# Patient Record
Sex: Male | Born: 1940 | Hispanic: Yes | Marital: Married | Smoking: Former smoker
Health system: Southern US, Community
[De-identification: ages and names within clinical notes are randomized; demographics above are authoritative.]

## PROBLEM LIST (undated history)

## (undated) DIAGNOSIS — R7303 Prediabetes: Secondary | ICD-10-CM

## (undated) DIAGNOSIS — E785 Hyperlipidemia, unspecified: Secondary | ICD-10-CM

## (undated) DIAGNOSIS — E039 Hypothyroidism, unspecified: Secondary | ICD-10-CM

## (undated) DIAGNOSIS — N400 Enlarged prostate without lower urinary tract symptoms: Secondary | ICD-10-CM

## (undated) DIAGNOSIS — U071 COVID-19: Secondary | ICD-10-CM

## (undated) DIAGNOSIS — Z789 Other specified health status: Secondary | ICD-10-CM

## (undated) DIAGNOSIS — Z973 Presence of spectacles and contact lenses: Secondary | ICD-10-CM

## (undated) DIAGNOSIS — J45909 Unspecified asthma, uncomplicated: Secondary | ICD-10-CM

## (undated) HISTORY — DX: Unspecified asthma, uncomplicated: J45.909

---

## 1995-09-01 HISTORY — PX: FINGER SURGERY: SHX640

## 2008-07-27 ENCOUNTER — Encounter: Payer: Self-pay | Admitting: Neurosurgery

## 2008-07-27 ENCOUNTER — Inpatient Hospital Stay (HOSPITAL_COMMUNITY): Admission: AD | Admit: 2008-07-27 | Discharge: 2008-07-30 | Payer: Self-pay | Admitting: Neurosurgery

## 2011-01-13 NOTE — Consult Note (Signed)
NAMEMUHAMMAD, VACCA NO.:  [id number]   MEDICAL RECORD NO.:  [id number]          PATIENT TYPE:  EMS   LOCATION:  ED                           FACILITY:  [facility]   PHYSICIAN:  Cristi Loron, M.D.DATE OF BIRTH:  03-19-1941   DATE OF CONSULTATION:  07/27/2008  DATE OF DISCHARGE:  07/27/2008                                 CONSULTATION   CHIEF COMPLAINT:  Numbness.   HISTORY OF PRESENT ILLNESS:  The patient is a 70 year old Hispanic male,  an immigrant from [place], who began having some numbness in his right  arm a few days ago.  He does not recall any precipitating event.  This  progressed to a more generalized right-sided weakness and numbness in  his right face.  The patient was taken to [facility] via  private vehicle by his son and worked up with a cranial CT scan,  followed by an MRI scan by Dr. Freida Busman, and a brainstem lesion was  identified, and neurosurgical consultation was requested.   Presently, the patient is accompanied by his son and grandson, who speak  excellent [place].  He denies headaches, nausea, vomiting, or seizures.  He says he is a bit numb on the right side.  He has been somewhat  unsteady on his feet.  There has been no history of fevers, etc.  He has  no known parasitic diseases.   PAST MEDICAL HISTORY:  Negative.   PAST SURGICAL HISTORY:  None.   MEDICATIONS PRIOR TO ADMISSION:  None.   ALLERGIES:  No known drug allergies.   FAMILY MEDICAL HISTORY:  The patient's mother is still alive in her 52s.  The patient's father died in his 33s.   SOCIAL HISTORY:  The patient is married.  He has 2 children.  He is an  immigrant from [place].  He is retired.  He lives in [place].  He  denies tobacco, ethanol, or drug use.   REVIEW OF SYSTEMS:  Template.   PHYSICAL EXAMINATION:  GENERAL:  A pleasant 70 year old Hispanic male,  in no apparent distress, with a left ptosis.  VITAL SIGNS:  Blood pressure 141/78, heart rate 95,  respiratory rate 18,  and temperature 98.3 degrees Fahrenheit orally.  HEENT:  Normocephalic.  No signs of trauma.  He has left ptosis.  His  pupils are equal, round, and reactive to light.  Extraocular muscles are  intact.  There is no evidence of CSF otorrhea/rhinorrhea, Battle sign,  or raccoon eyes.  NECK:  Supple.  There are no masses, deformities, tracheal deviation,  jugular venous distention or bruits.  He has a normal range of motion  for his age.  Spurling testing is negative.  Lhermitte sign was not  present.  THORAX:  Symmetric.  ABDOMEN:  Soft.  EXTREMITIES:  No obvious deformities.  BACK:  Normal.  NEUROLOGIC:  The patient is alert and oriented x3.  Cranial nerves II  through XII were examined bilaterally, grossly normal.  Vision and  hearing grossly normal bilaterally.  He denies diplopia.  Motor strength  is 5/5 in his bilateral biceps, triceps, handgrip, quadriceps,  gastrocnemius, and extensor hallucis longus.  Sensory function  demonstrates some decreased light touch sensation in his right face and  arm, otherwise unremarkable.  Deep tendon reflexes are 2/4 in bilateral  biceps, triceps, and quadriceps.   IMAGING STUDIES:  I have reviewed the patient's cranial CT scan  performed at [facility] today, i.e., July 27, 2008.  It  demonstrates the patient has a hyperdensity in his left midbrain in the  region of the left quadrigeminal plate.  There is some mass effect on  aqueduct of [place].  I do not see any signs of acute hydrocephalus.   I also reviewed the patient's brain MRI performed at [facility] on July 27, 2008.  It demonstrates similar findings as  above with some signal intensity consistent with hemorrhage.   ASSESSMENT AND PLAN:  Left midbrain/quadrigeminal plate hemorrhage.  There are several possibilities, most likely of which is he has a  brainstem cavernous angioma which has bled a bit.  I discussed this with  the patient  and his son and grandson.  I am recommending he be admitted  for observation, and we will follow him with serial scans, and after  blood resolves, we will repeat his brain MRI scan.  I did tell him there  are other possibilities such as tumor, other types of vascular  malformations, and the possibility of infection such as  neurocysticercosis, although I doubt it.  I have answered all their  questions.  He is going to be transferred to [facility] and  observed there.      Cristi Loron, M.D.  Electronically Signed     JDJ/MEDQ  D:  07/27/2008  T:  07/28/2008  Job:  [id number]

## 2011-01-16 NOTE — Discharge Summary (Signed)
NAMELEVAUGHN, PUCCINELLI NO.:  [id number]   MEDICAL RECORD NO.:  [id number]          PATIENT TYPE:  INP   LOCATION:  3113                         FACILITY:  [facility]   PHYSICIAN:  Cristi Loron, M.D.DATE OF BIRTH:  1941-03-07   DATE OF ADMISSION:  07/27/2008  DATE OF DISCHARGE:  07/30/2008                               DISCHARGE SUMMARY   For full details of this admission, please refer to typed history and  physical.   HOSPITAL COURSE:  The patient is a 70 year old Hispanic male who was  admitted for right hand numbness and MRI scan, which demonstrated he had  a midbrain lesion with a small hemorrhage presumed to be cavernous  angioma.  Admitted the patient to [facility] on July 27, 2008.  Observed him.  Repeated  his CAT scan and it was stable.  His  symptoms resolved and by July 30, 2008, the patient was afebrile.  He was neurologically normal.  He was requesting discharge to home.  He  was therefore discharged to home.   DISCHARGE INSTRUCTIONS:  The patient was given discharge instructions.  He was instructed to follow up with me in 4 weeks.   FINAL DIAGNOSIS:  Brainstem cavernous angioma.   PROCEDURE PERFORMED:  None.      Cristi Loron, M.D.  Electronically Signed     JDJ/MEDQ  D:  09/13/2008  T:  09/14/2008  Job:  [id number]

## 2011-06-02 LAB — DIFFERENTIAL
Basophils Relative: 0
Eosinophils Absolute: 0
Neutro Abs: 5.7
Neutrophils Relative %: 76

## 2011-06-02 LAB — CBC
MCHC: 33.1
MCV: 88.7
Platelets: 298
WBC: 7.6

## 2011-06-02 LAB — POCT I-STAT, CHEM 8
Chloride: 101
HCT: 52
Hemoglobin: 17.7 — ABNORMAL HIGH
Potassium: 4.8
Sodium: 137

## 2011-06-02 LAB — PROTIME-INR: INR: 1

## 2011-06-02 LAB — COMPREHENSIVE METABOLIC PANEL
ALT: 27
AST: 26
Albumin: 3.7
Alkaline Phosphatase: 94
GFR calc Af Amer: >60
Potassium: 4.3
Sodium: 138
Total Protein: 6.6

## 2014-08-31 HISTORY — PX: HERNIA REPAIR: SHX51

## 2017-05-17 ENCOUNTER — Other Ambulatory Visit: Payer: Self-pay | Admitting: Orthopedic Surgery

## 2017-05-17 DIAGNOSIS — M87052 Idiopathic aseptic necrosis of left femur: Secondary | ICD-10-CM

## 2017-05-26 ENCOUNTER — Ambulatory Visit
Admission: RE | Admit: 2017-05-26 | Discharge: 2017-05-26 | Disposition: A | Discharge status: Home / Self Care | Payer: Medicare Other | Source: Ambulatory Visit | Attending: Orthopedic Surgery | Admitting: Orthopedic Surgery

## 2017-05-26 DIAGNOSIS — M1612 Unilateral primary osteoarthritis, left hip: Secondary | ICD-10-CM | POA: Insufficient documentation

## 2017-05-26 DIAGNOSIS — M25552 Pain in left hip: Secondary | ICD-10-CM | POA: Insufficient documentation

## 2017-05-26 DIAGNOSIS — M65852 Other synovitis and tenosynovitis, left thigh: Secondary | ICD-10-CM | POA: Insufficient documentation

## 2017-05-26 DIAGNOSIS — M25452 Effusion, left hip: Secondary | ICD-10-CM | POA: Insufficient documentation

## 2017-05-26 DIAGNOSIS — N400 Enlarged prostate without lower urinary tract symptoms: Secondary | ICD-10-CM | POA: Insufficient documentation

## 2017-05-26 DIAGNOSIS — M87052 Idiopathic aseptic necrosis of left femur: Secondary | ICD-10-CM | POA: Diagnosis present

## 2017-06-15 ENCOUNTER — Encounter
Admission: RE | Admit: 2017-06-15 | Discharge: 2017-06-15 | Disposition: A | Discharge status: Home / Self Care | Payer: Medicare Other | Source: Ambulatory Visit | Attending: Orthopedic Surgery | Admitting: Orthopedic Surgery

## 2017-06-15 DIAGNOSIS — Z01812 Encounter for preprocedural laboratory examination: Secondary | ICD-10-CM | POA: Insufficient documentation

## 2017-06-15 DIAGNOSIS — Z01818 Encounter for other preprocedural examination: Secondary | ICD-10-CM | POA: Insufficient documentation

## 2017-06-15 DIAGNOSIS — I4510 Unspecified right bundle-branch block: Secondary | ICD-10-CM | POA: Diagnosis not present

## 2017-06-15 DIAGNOSIS — Z0183 Encounter for blood typing: Secondary | ICD-10-CM | POA: Insufficient documentation

## 2017-06-15 HISTORY — DX: Other specified health status: Z78.9

## 2017-06-15 LAB — URINALYSIS, ROUTINE W REFLEX MICROSCOPIC
BILIRUBIN URINE: NEGATIVE
GLUCOSE, UA: NEGATIVE mg/dL
HGB URINE DIPSTICK: NEGATIVE
KETONES UR: NEGATIVE mg/dL
Leukocytes, UA: NEGATIVE
NITRITE: NEGATIVE
PH: 6 (ref 5.0–8.0)
Protein, ur: NEGATIVE mg/dL
SPECIFIC GRAVITY, URINE: 1.006 (ref 1.005–1.030)

## 2017-06-15 LAB — CBC
HEMATOCRIT: 44.1 % (ref 40.0–52.0)
HEMOGLOBIN: 14.7 g/dL (ref 13.0–18.0)
MCH: 28.9 pg (ref 26.0–34.0)
MCHC: 33.3 g/dL (ref 32.0–36.0)
MCV: 86.6 fL (ref 80.0–100.0)
PLATELETS: 263 10*3/uL (ref 150–440)
RBC: 5.09 MIL/uL (ref 4.40–5.90)
RDW: 14.6 % — ABNORMAL HIGH (ref 11.5–14.5)
WBC: 5.2 10*3/uL (ref 3.8–10.6)

## 2017-06-15 LAB — SURGICAL PCR SCREEN
MRSA, PCR: NEGATIVE
STAPHYLOCOCCUS AUREUS: NEGATIVE

## 2017-06-15 LAB — BASIC METABOLIC PANEL
ANION GAP: 8 (ref 5–15)
BUN: 17 mg/dL (ref 6–20)
CALCIUM: 9.1 mg/dL (ref 8.9–10.3)
CO2: 28 mmol/L (ref 22–32)
Chloride: 102 mmol/L (ref 101–111)
Creatinine, Ser: 0.87 mg/dL (ref 0.61–1.24)
GFR calc Af Amer: >60 mL/min (ref >60)
Glucose, Bld: 100 mg/dL — ABNORMAL HIGH (ref 65–99)
POTASSIUM: 4 mmol/L (ref 3.5–5.1)
SODIUM: 138 mmol/L (ref 135–145)

## 2017-06-15 LAB — TYPE AND SCREEN
ABO/RH(D): O POS
ANTIBODY SCREEN: NEGATIVE

## 2017-06-15 LAB — SEDIMENTATION RATE: SED RATE: 4 mm/h (ref 0–20)

## 2017-06-15 LAB — PROTIME-INR
INR: 0.95
PROTHROMBIN TIME: 12.6 s (ref 11.4–15.2)

## 2017-06-15 LAB — APTT: aPTT: 27 seconds (ref 24–36)

## 2017-06-15 NOTE — Pre-Procedure Instructions (Signed)
Called Joel Adkins to see what time pt is scheduled for surgery, she was unable to tell me due to Dr. Rudene Christians deciding the order for surgery 1-2 days prior to surgery.  Pt's son will call on 06/21/17 to find out pt's arrival time.

## 2017-06-15 NOTE — Patient Instructions (Signed)
Your procedure is scheduled on: 06/22/2017. Su procedimiento est programado para: Report to Same Day Surgery. Presntese a: To find out your arrival time please call [phone] between 1PM - 3PM on Monday 06/21/2017. Para saber su hora de llegada por favor llame al [phone] entre la 1PM - 3PM el da:  Remember: Instructions that are not followed completely may result in serious medical risk, up to and including death, or upon the discretion of your surgeon and anesthesiologist your surgery may need to be rescheduled.  Recuerde: Las instrucciones que no se siguen completamente Heritage manager en un riesgo de salud grave, incluyendo hasta la [place] o a discrecin de su cirujano y Environmental health practitioner, su ciruga se puede posponer.   __X_ 1.Do not eat food after midnight the night before your procedure. No   gum chewing or hard candies. You may drink clear liquids up to 2 hours   before you are scheduled to arrive for your surgery- DO not drink clear   liquids within 2 hours of the start of your surgery.     Clear Liquids include:    water, apple juice without pulp, clear carbohydrate drink such as    Clearfast of [place], Black Coffee or Tea (Do not add anything to   coffee or tea).      No coma nada despus de la medianoche de la noche anterior a su   procedimiento. No coma chicles ni caramelos duros. Puede tomar   lquidos claros hasta 2 horas antes de su hora programada de llegada al   hospital para su procedimiento. No tome lquidos claros durante el   transcurso de las 2 horas de su llegada programada al hospital para su   procedimiento, ya que esto puede llevar a que su procedimiento se   retrase o tenga que volver a Health and safety inspector.  Los lquidos claros incluyen:         - Agua o jugo de [place] [facility] sin pulpa         - Bebidas claras con carbohidratos como ClearFast o Gatorade         - Caf negro o t claro (sin leche, sin cremas, no agregue nada al caf ni al  t)  No tome nada que no est  en esta lista.  Los pacientes con diabetes tipo 1 y tipo 2 solo deben Agricultural engineer.  Llame a la clnica de [facility] o a la unidad de Same Day Surgery si  tiene alguna pregunta sobre estas instrucciones.              _X__ 2.Do Not Smoke or use e-cigarettes For 24 Hours Prior to Your    Surgery.  Do not use any chewable tobacco products for at least 6   hours prior to surgery.    No fume ni use cigarrillos electrnicos durante las 24 horas previas   a su [place].  No use ningn producto de tabaco masticable durante  al menos 6 horas antes de la [place].     __X_ 3. No alcohol for 24 hours before or after surgery.    No tome alcohol durante las 24 horas antes ni despus de la [place].   ____4. Bring all medications with you on the day of surgery if instructed.    Lleve todos los medicamentos con usted el da de su ciruga si se le   ha indicado as.   ____ 5. Notify your doctor if there is any change in your medical condition (cold,   fever, infections).  Informe a su mdico si hay algn cambio en su condicin mdica   (resfriado, fiebre, infecciones).   Do not wear jewelry, make-up, hairpins, clips or nail polish.  No use joyas, maquillajes, pinzas/ganchos para el cabello ni esmalte de uas.  Do not wear lotions, powders, or perfumes. You may wear deodorant.  No use lociones, polvos o perfumes.  Puede usar desodorante.    Do not shave 48 hours prior to surgery. Men may shave face and neck.  No se afeite 48 horas antes de la [place].  Los hombres pueden [employer] cara  y el cuello.   Do not bring valuables to the hospital.   No lleve objetos [facility] is not responsible for any belongings or valuables.  [place] no se hace responsable de ningn tipo de pertenencias u objetos de Geographical information systems officer.               Contacts, dentures or bridgework may not be worn into surgery.  Los lentes de [place], las dentaduras postizas o puentes no se pueden usar en la [place].  Leave  your suitcase in the car. After surgery it may be brought to your room.  Deje su maleta en el auto.  Despus de la ciruga podr traerla a su habitacin.  For patients admitted to the hospital, discharge time is determined by your treatment team.  Para los pacientes que sean ingresados al hospital, el tiempo en el cual se le dar de alta es determinado por su equipo de [place].   Patients discharged the day of surgery will not be allowed to drive home. A los pacientes que se les da de alta el mismo da de la ciruga no se les permitir conducir a Holiday representative.   Please read over the following fact sheets that you were given: Por favor [place] hojas de informacin que le dieron:   Preparing for surgery   ____ Take these medicines the morning of surgery with A SIP OF [place] de la ciruga con UN SORBO DE AGUA:    ____ Fleet Enema (as directed)          Enema de Fleet (segn lo indicado)    _x___ Use CHG Soap as directed          Utilice el jabn de CHG segn lo indicado  ____ Use inhalers on the day of surgery          Use los inhaladores el da de la ciruga  ____ Stop metformin 2 days prior to surgery          Deje de tomar el metformin 2 das antes de la ciruga    ____ Take 1/2 of usual insulin dose the night before surgery and none on the morning of surgery           Tome la mitad de la dosis habitual de insulina la noche antes de la [place] y no tome nada en la maana de la             ciruga  ____ Stop Coumadin/Plavix/aspirin on does not apply          Deje de tomar el Coumadin/Plavix/aspirina el da:  ____ Stop Anti-inflammatories on: diclofenac (VOLTAREN).  OK to take Tylenol.          Deje de tomar antiinflamatorios el da:   ____ Stop supplements until after surgery  Deje de tomar suplementos hasta despus de la ciruga  ____ Bring C-Pap to the hospital          [place] al hospital

## 2017-06-15 NOTE — Pre-Procedure Instructions (Signed)
Maritza here to interprete.

## 2017-06-16 LAB — URINE CULTURE: Culture: NO GROWTH

## 2017-06-17 NOTE — Progress Notes (Signed)
EKG results reviewed by Dr. Marcello Moores (anesthesiology); recommends patient get medical clearance prior to surgery due to possible new right bundle branch block. Medical clearance form sent to patient's PCP Gennette Pac, FNP).

## 2017-07-09 NOTE — Pre-Procedure Instructions (Signed)
Cardiac clearance from Dr. Saralyn Pilar low risk on front of chart.  Medical clearance from [facility] FNP low risk in front of pt's chart.

## 2017-07-13 ENCOUNTER — Other Ambulatory Visit: Payer: Self-pay

## 2017-07-13 ENCOUNTER — Inpatient Hospital Stay: Payer: Medicare Other

## 2017-07-13 ENCOUNTER — Encounter
Admission: RE | Disposition: A | Discharge status: Home Health | Payer: Self-pay | Source: Ambulatory Visit | Attending: Orthopedic Surgery

## 2017-07-13 ENCOUNTER — Inpatient Hospital Stay
Admission: RE | Admit: 2017-07-13 | Discharge: 2017-07-16 | DRG: 470 | Disposition: A | Discharge status: Home Health | Payer: Medicare Other | Source: Ambulatory Visit | Attending: Orthopedic Surgery | Admitting: Orthopedic Surgery

## 2017-07-13 ENCOUNTER — Inpatient Hospital Stay: Payer: Medicare Other | Admitting: Anesthesiology

## 2017-07-13 DIAGNOSIS — Z419 Encounter for procedure for purposes other than remedying health state, unspecified: Secondary | ICD-10-CM

## 2017-07-13 DIAGNOSIS — M87052 Idiopathic aseptic necrosis of left femur: Principal | ICD-10-CM | POA: Diagnosis present

## 2017-07-13 DIAGNOSIS — G8918 Other acute postprocedural pain: Secondary | ICD-10-CM

## 2017-07-13 DIAGNOSIS — Z87891 Personal history of nicotine dependence: Secondary | ICD-10-CM | POA: Diagnosis not present

## 2017-07-13 DIAGNOSIS — M87852 Other osteonecrosis, left femur: Secondary | ICD-10-CM | POA: Diagnosis present

## 2017-07-13 DIAGNOSIS — M25552 Pain in left hip: Secondary | ICD-10-CM | POA: Diagnosis present

## 2017-07-13 HISTORY — PX: TOTAL HIP ARTHROPLASTY: SHX124

## 2017-07-13 LAB — TYPE AND SCREEN
ABO/RH(D): O POS
ANTIBODY SCREEN: NEGATIVE

## 2017-07-13 SURGERY — ARTHROPLASTY, HIP, TOTAL, ANTERIOR APPROACH
Anesthesia: Spinal | Site: Hip | Laterality: Left | Wound class: Clean

## 2017-07-13 MED ORDER — ONDANSETRON HCL 4 MG/2ML IJ SOLN: 4.0000 mg | Freq: Four times a day (QID) | INTRAMUSCULAR | Status: DC | PRN

## 2017-07-13 MED ORDER — ENOXAPARIN SODIUM 40 MG/0.4ML ~~LOC~~ SOLN
40.0000 mg | SUBCUTANEOUS | Status: DC
  Administered 2017-07-14 – 2017-07-16 (×3): 40 mg via SUBCUTANEOUS
  Filled 2017-07-13 (×3): qty 0.4

## 2017-07-13 MED ORDER — OXYCODONE HCL 5 MG PO TABS
5.0000 mg | ORAL_TABLET | ORAL | Status: DC | PRN
  Administered 2017-07-13 – 2017-07-16 (×8): 5 mg via ORAL
  Filled 2017-07-13 (×6): qty 1

## 2017-07-13 MED ORDER — FENTANYL CITRATE (PF) 100 MCG/2ML IJ SOLN
INTRAMUSCULAR | Status: DC | PRN
  Administered 2017-07-13: 25 ug via INTRAVENOUS

## 2017-07-13 MED ORDER — MAGNESIUM CITRATE PO SOLN
1.0000 | Freq: Once | ORAL | Status: DC | PRN
  Filled 2017-07-13 (×2): qty 296

## 2017-07-13 MED ORDER — PROPOFOL 500 MG/50ML IV EMUL
INTRAVENOUS | Status: DC | PRN
  Administered 2017-07-13: 50 ug/kg/min via INTRAVENOUS

## 2017-07-13 MED ORDER — ACETAMINOPHEN 650 MG RE SUPP: 650.0000 mg | RECTAL | Status: DC | PRN

## 2017-07-13 MED ORDER — MAGNESIUM HYDROXIDE 400 MG/5ML PO SUSP
30.0000 mL | Freq: Every day | ORAL | Status: DC | PRN
  Administered 2017-07-14 – 2017-07-15 (×2): 30 mL via ORAL
  Filled 2017-07-13: qty 30

## 2017-07-13 MED ORDER — FAMOTIDINE 20 MG PO TABS
20.0000 mg | ORAL_TABLET | Freq: Once | ORAL | Status: AC
  Administered 2017-07-13: 20 mg via ORAL

## 2017-07-13 MED ORDER — ZOLPIDEM TARTRATE 5 MG PO TABS: 5.0000 mg | ORAL_TABLET | Freq: Every evening | ORAL | Status: DC | PRN

## 2017-07-13 MED ORDER — METOCLOPRAMIDE HCL 5 MG/ML IJ SOLN: 5.0000 mg | Freq: Three times a day (TID) | INTRAMUSCULAR | Status: DC | PRN

## 2017-07-13 MED ORDER — CEFAZOLIN SODIUM-DEXTROSE 2-4 GM/100ML-% IV SOLN
2.0000 g | Freq: Once | INTRAVENOUS | Status: DC
  Filled 2017-07-13 (×3): qty 100

## 2017-07-13 MED ORDER — BUPIVACAINE HCL (PF) 0.5 % IJ SOLN
INTRAMUSCULAR | Status: AC
  Filled 2017-07-13: qty 10

## 2017-07-13 MED ORDER — BUPIVACAINE HCL (PF) 0.5 % IJ SOLN
INTRAMUSCULAR | Status: DC | PRN
  Administered 2017-07-13: 3 mL

## 2017-07-13 MED ORDER — PHENYLEPHRINE HCL 10 MG/ML IJ SOLN
INTRAMUSCULAR | Status: AC
  Filled 2017-07-13: qty 1

## 2017-07-13 MED ORDER — DOCUSATE SODIUM 100 MG PO CAPS
100.0000 mg | ORAL_CAPSULE | Freq: Two times a day (BID) | ORAL | Status: DC
  Administered 2017-07-13 – 2017-07-16 (×6): 100 mg via ORAL
  Filled 2017-07-13 (×6): qty 1

## 2017-07-13 MED ORDER — TRANEXAMIC ACID 1000 MG/10ML IV SOLN
1000.0000 mg | INTRAVENOUS | Status: DC
  Filled 2017-07-13: qty 10

## 2017-07-13 MED ORDER — MORPHINE SULFATE (PF) 2 MG/ML IV SOLN
2.0000 mg | INTRAVENOUS | Status: DC | PRN
  Administered 2017-07-13: 2 mg via INTRAVENOUS
  Filled 2017-07-13: qty 1

## 2017-07-13 MED ORDER — METHOCARBAMOL 500 MG PO TABS
500.0000 mg | ORAL_TABLET | Freq: Four times a day (QID) | ORAL | Status: DC | PRN
  Administered 2017-07-15: 500 mg via ORAL
  Filled 2017-07-13: qty 1

## 2017-07-13 MED ORDER — PHENOL 1.4 % MT LIQD
1.0000 | OROMUCOSAL | Status: DC | PRN
  Filled 2017-07-13: qty 177

## 2017-07-13 MED ORDER — FENTANYL CITRATE (PF) 100 MCG/2ML IJ SOLN: 25.0000 ug | INTRAMUSCULAR | Status: DC | PRN

## 2017-07-13 MED ORDER — GABAPENTIN 100 MG PO CAPS
100.0000 mg | ORAL_CAPSULE | Freq: Every day | ORAL | Status: DC
  Administered 2017-07-13 – 2017-07-15 (×3): 100 mg via ORAL
  Filled 2017-07-13 (×3): qty 1

## 2017-07-13 MED ORDER — MENTHOL 3 MG MT LOZG
1.0000 | LOZENGE | OROMUCOSAL | Status: DC | PRN
  Filled 2017-07-13: qty 9

## 2017-07-13 MED ORDER — DEXTROSE 5 % IV SOLN
Freq: Four times a day (QID) | INTRAVENOUS | Status: AC
  Administered 2017-07-13 – 2017-07-14 (×3): via INTRAVENOUS
  Filled 2017-07-13 (×3): qty 10

## 2017-07-13 MED ORDER — ACETAMINOPHEN 325 MG PO TABS
650.0000 mg | ORAL_TABLET | ORAL | Status: DC | PRN
  Filled 2017-07-13: qty 2

## 2017-07-13 MED ORDER — FE FUMARATE-B12-VIT C-FA-IFC PO CAPS
1.0000 | ORAL_CAPSULE | Freq: Two times a day (BID) | ORAL | Status: DC
  Administered 2017-07-14 – 2017-07-16 (×5): 1 via ORAL
  Filled 2017-07-13 (×6): qty 1

## 2017-07-13 MED ORDER — BUPIVACAINE-EPINEPHRINE (PF) 0.25% -1:200000 IJ SOLN
INTRAMUSCULAR | Status: AC
  Filled 2017-07-13: qty 30

## 2017-07-13 MED ORDER — DIPHENHYDRAMINE HCL 12.5 MG/5ML PO ELIX
12.5000 mg | ORAL_SOLUTION | ORAL | Status: DC | PRN
Start: 2017-07-13 — End: 2017-07-16

## 2017-07-13 MED ORDER — CEFAZOLIN SODIUM-DEXTROSE 2-3 GM-%(50ML) IV SOLR
INTRAVENOUS | Status: DC | PRN
  Administered 2017-07-13: 2 g via INTRAVENOUS

## 2017-07-13 MED ORDER — CEFAZOLIN SODIUM-DEXTROSE 2-4 GM/100ML-% IV SOLN
INTRAVENOUS | Status: AC
  Filled 2017-07-13: qty 100

## 2017-07-13 MED ORDER — METHOCARBAMOL 1000 MG/10ML IJ SOLN
500.0000 mg | Freq: Four times a day (QID) | INTRAVENOUS | Status: DC | PRN
  Filled 2017-07-13: qty 5

## 2017-07-13 MED ORDER — MIDAZOLAM HCL 5 MG/5ML IJ SOLN
INTRAMUSCULAR | Status: DC | PRN
  Administered 2017-07-13: 1 mg via INTRAVENOUS

## 2017-07-13 MED ORDER — SODIUM CHLORIDE 0.9 % IV SOLN
INTRAVENOUS | Status: DC
  Administered 2017-07-13: 21:00:00 via INTRAVENOUS

## 2017-07-13 MED ORDER — LACTATED RINGERS IV SOLN
INTRAVENOUS | Status: DC
Start: 2017-07-13 — End: 2017-07-13
  Administered 2017-07-13 (×3): via INTRAVENOUS

## 2017-07-13 MED ORDER — ONDANSETRON HCL 4 MG/2ML IJ SOLN: 4.0000 mg | Freq: Once | INTRAMUSCULAR | Status: DC | PRN

## 2017-07-13 MED ORDER — MIDAZOLAM HCL 2 MG/2ML IJ SOLN
INTRAMUSCULAR | Status: AC
  Filled 2017-07-13: qty 2

## 2017-07-13 MED ORDER — SODIUM CHLORIDE 0.9 % IV SOLN
INTRAVENOUS | Status: DC | PRN
  Administered 2017-07-13: 1000 mg via INTRAVENOUS

## 2017-07-13 MED ORDER — CEFAZOLIN SODIUM-DEXTROSE 1-4 GM/50ML-% IV SOLN: 1.0000 g | Freq: Four times a day (QID) | INTRAVENOUS | Status: DC

## 2017-07-13 MED ORDER — OXYCODONE HCL 5 MG PO TABS
10.0000 mg | ORAL_TABLET | ORAL | Status: DC | PRN
  Administered 2017-07-14 – 2017-07-15 (×4): 10 mg via ORAL
  Filled 2017-07-13 (×6): qty 2

## 2017-07-13 MED ORDER — FENTANYL CITRATE (PF) 100 MCG/2ML IJ SOLN
INTRAMUSCULAR | Status: AC
  Filled 2017-07-13: qty 2

## 2017-07-13 MED ORDER — BISACODYL 10 MG RE SUPP
10.0000 mg | Freq: Every day | RECTAL | Status: DC | PRN
  Administered 2017-07-16: 10 mg via RECTAL
  Filled 2017-07-13: qty 1

## 2017-07-13 MED ORDER — BUPIVACAINE-EPINEPHRINE 0.25% -1:200000 IJ SOLN
INTRAMUSCULAR | Status: DC | PRN
  Administered 2017-07-13: 30 mL

## 2017-07-13 MED ORDER — METOCLOPRAMIDE HCL 10 MG PO TABS: 5.0000 mg | ORAL_TABLET | Freq: Three times a day (TID) | ORAL | Status: DC | PRN

## 2017-07-13 MED ORDER — PHENYLEPHRINE HCL 10 MG/ML IJ SOLN
INTRAMUSCULAR | Status: DC | PRN
  Administered 2017-07-13 (×2): 200 ug via INTRAVENOUS
  Administered 2017-07-13: 100 ug via INTRAVENOUS

## 2017-07-13 MED ORDER — ONDANSETRON HCL 4 MG PO TABS
4.0000 mg | ORAL_TABLET | Freq: Four times a day (QID) | ORAL | Status: DC | PRN
  Administered 2017-07-14: 4 mg via ORAL
  Filled 2017-07-13: qty 1

## 2017-07-13 MED ORDER — HYPROMELLOSE (GONIOSCOPIC) 2.5 % OP SOLN
1.0000 [drp] | Freq: Three times a day (TID) | OPHTHALMIC | Status: DC | PRN
  Filled 2017-07-13: qty 15

## 2017-07-13 MED ORDER — PROPOFOL 10 MG/ML IV BOLUS
INTRAVENOUS | Status: DC | PRN
Start: 2017-07-13 — End: 2017-07-14
  Administered 2017-07-13: 30 mg via INTRAVENOUS

## 2017-07-13 MED ORDER — LIDOCAINE HCL (PF) 2 % IJ SOLN
INTRAMUSCULAR | Status: AC
  Filled 2017-07-13: qty 10

## 2017-07-13 MED ORDER — PHENYLEPHRINE HCL 10 MG/ML IJ SOLN
INTRAMUSCULAR | Status: DC | PRN
  Administered 2017-07-13: 30 ug/min via INTRAVENOUS

## 2017-07-13 MED ORDER — NEOMYCIN-POLYMYXIN B GU 40-200000 IR SOLN
Status: DC | PRN
  Administered 2017-07-13: 4 mL

## 2017-07-13 MED ORDER — NEOMYCIN-POLYMYXIN B GU 40-200000 IR SOLN
Status: AC
  Filled 2017-07-13: qty 4

## 2017-07-13 MED ORDER — PROPOFOL 500 MG/50ML IV EMUL
INTRAVENOUS | Status: AC
  Filled 2017-07-13: qty 50

## 2017-07-13 MED ORDER — FAMOTIDINE 20 MG PO TABS
ORAL_TABLET | ORAL | Status: AC
  Filled 2017-07-13: qty 1

## 2017-07-13 SURGICAL SUPPLY — 52 items
BLADE SAW SAG 18.5X105 (BLADE) ×3 IMPLANT
BNDG COHESIVE 6X5 TAN STRL LF (GAUZE/BANDAGES/DRESSINGS) ×5 IMPLANT
CANISTER SUCT 1200ML W/VALVE (MISCELLANEOUS) ×3 IMPLANT
CAPT HIP TOTAL 3 ×2 IMPLANT
CATH TRAY METER 16FR LF (MISCELLANEOUS) ×3 IMPLANT
CHLORAPREP W/TINT 26ML (MISCELLANEOUS) ×3 IMPLANT
DRAPE C-ARM XRAY 36X54 (DRAPES) ×3 IMPLANT
DRAPE INCISE IOBAN 66X60 STRL (DRAPES) IMPLANT
DRAPE POUCH INSTRU U-SHP 10X18 (DRAPES) ×3 IMPLANT
DRAPE SHEET LG 3/4 BI-LAMINATE (DRAPES) ×9 IMPLANT
DRAPE TABLE BACK 80X90 (DRAPES) ×3 IMPLANT
DRESSING SURGICEL FIBRLLR 1X2 (HEMOSTASIS) ×2 IMPLANT
DRSG OPSITE POSTOP 4X8 (GAUZE/BANDAGES/DRESSINGS) ×6 IMPLANT
DRSG SURGICEL FIBRILLAR 1X2 (HEMOSTASIS) ×6
ELECT BLADE 6.5 EXT (BLADE) ×3 IMPLANT
ELECT REM PT RETURN 9FT ADLT (ELECTROSURGICAL) ×3
ELECTRODE REM PT RTRN 9FT ADLT (ELECTROSURGICAL) ×1 IMPLANT
EVACUATOR 1/8 PVC DRAIN (DRAIN) IMPLANT
GLOVE BIOGEL PI IND STRL 9 (GLOVE) ×1 IMPLANT
GLOVE BIOGEL PI INDICATOR 9 (GLOVE) ×2
GLOVE SURG SYN 9.0  PF PI (GLOVE) ×4
GLOVE SURG SYN 9.0 PF PI (GLOVE) ×2 IMPLANT
GOWN SRG 2XL LVL 4 RGLN SLV (GOWNS) ×1 IMPLANT
GOWN STRL NON-REIN 2XL LVL4 (GOWNS) ×3
GOWN STRL REUS W/ TWL LRG LVL3 (GOWN DISPOSABLE) ×1 IMPLANT
GOWN STRL REUS W/TWL LRG LVL3 (GOWN DISPOSABLE) ×3
HOLDER FOLEY CATH W/STRAP (MISCELLANEOUS) ×3 IMPLANT
HOOD PEEL AWAY FLYTE STAYCOOL (MISCELLANEOUS) ×3 IMPLANT
KIT PREVENA INCISION MGT 13 (CANNISTER) ×1 IMPLANT
MAT BLUE FLOOR 46X72 FLO (MISCELLANEOUS) ×3 IMPLANT
NDL SAFETY 18GX1.5 (NEEDLE) ×3 IMPLANT
NDL SPNL 18GX3.5 QUINCKE PK (NEEDLE) ×1 IMPLANT
NEEDLE SPNL 18GX3.5 QUINCKE PK (NEEDLE) ×3 IMPLANT
NS IRRIG 1000ML POUR BTL (IV SOLUTION) ×3 IMPLANT
PACK HIP COMPR (MISCELLANEOUS) ×3 IMPLANT
SOL PREP PVP 2OZ (MISCELLANEOUS) ×3
SOLUTION PREP PVP 2OZ (MISCELLANEOUS) ×1 IMPLANT
SPONGE DRAIN TRACH 4X4 STRL 2S (GAUZE/BANDAGES/DRESSINGS) ×1 IMPLANT
STAPLER SKIN PROX 35W (STAPLE) ×3 IMPLANT
STRAP SAFETY BODY (MISCELLANEOUS) ×3 IMPLANT
SUT DVC 2 QUILL PDO  T11 36X36 (SUTURE) ×2
SUT DVC 2 QUILL PDO T11 36X36 (SUTURE) ×1 IMPLANT
SUT SILK 0 (SUTURE) ×3
SUT SILK 0 30XBRD TIE 6 (SUTURE) ×1 IMPLANT
SUT V-LOC 90 ABS DVC 3-0 CL (SUTURE) ×3 IMPLANT
SUT VIC AB 1 CT1 36 (SUTURE) ×3 IMPLANT
SYR 20CC LL (SYRINGE) ×3 IMPLANT
SYR 30ML LL (SYRINGE) ×3 IMPLANT
SYR BULB IRRIG 60ML STRL (SYRINGE) ×3 IMPLANT
TAPE MICROFOAM 4IN (TAPE) ×1 IMPLANT
TOWEL OR 17X26 4PK STRL BLUE (TOWEL DISPOSABLE) ×3 IMPLANT
WND VAC CANISTER 500ML (MISCELLANEOUS) ×1 IMPLANT

## 2017-07-13 NOTE — H&P (Signed)
Chief Complaint  Patient presents with  . Hip Pain  Left, discuss CT results   History of the Present Illness: Joel Adkins is a 76 y.o. male here for follow-up of severe left hip osteoarthritis. He was recently seen by Rachelle Hora, PA. The patient had a CT scan to assess his bone quality. He does have some significant bone loss superiorly with very thin bone medially as well in the acetabulum. There is does appear to be adequate bone stock for a total hip replacement.  The patient presents today with an interpreter.  I have reviewed past medical, surgical, social and family history, and allergies as documented in the EMR.  Past Medical History: History reviewed. No pertinent past medical history.  Past Surgical History: Past Surgical History:  Procedure Laterality Date  . HERNIA REPAIR   Past Family History: Family History  Problem Relation Age of Onset  . No Known Problems Mother   Medications: Current Outpatient Prescriptions Ordered in Epic  Medication Sig Dispense Refill  . diclofenac (VOLTAREN) 75 MG EC tablet Take 75 mg by mouth 2 (two) times daily with meals.  . gabapentin (NEURONTIN) 100 MG capsule Take 100 mg by mouth 3 (three) times daily.   No current Epic-ordered facility-administered medications on file.   Allergies: No Known Allergies   Body mass index is 28.17 kg/m.  Review of Systems:A comprehensive 14 point ROS was performed, reviewed, and the pertinent orthopaedic findings are documented in the HPI.  Vitals:  06/09/17 1026  BP: 142/80   General Physical Examination:  General/Constitutional: No apparent distress: well-nourished and well developed. Eyes: Pupils equal, round with synchronous movement. Lungs: Clear to auscultation HEENT: Normal Vascular: No edema, swelling or tenderness, except as noted in detailed exam. Cardiac: Heart rate and rhythm is regular. Integumentary: No impressive skin lesions present, except as noted in  detailed exam. Neuro/Psych: Normal mood and affect, oriented to person, place and time.  Musculoskeletal Examination: On exam, the patient has at least a 1 inch leg length discrepancy on the left. He has good pulses. He has a fixed 20 degree external rotation contracture. He can externally rotate to 30 degrees. He has a very antalgic gait.   Lungs are clear except for mild wheezing. Heart rate and rhythm is normal. HEENT is normal.  Radiographs: The patient's imaging was reviewed today as noted above.  Assessment: ICD-10-CM ICD-9-CM  1. Avascular necrosis of bone of hip, left (CMS-HCC) M87.052 733.42   Plan: We discussed his condition and treatment options today. I recommend he have a left hip total replacement. The procedure and postoperative care were discussed at length today.   Surgical Risks: The nature of the condition and the proposed procedure has been reviewed in detail with the patient. Surgical versus non-surgical options and prognosis for recovery have been reviewed and the inherent risks and benefits of each have been discussed including the risks of infection, bleeding, injury to nerves / blood vessels / tendons, incomplete relief of symptoms, persisting pain and / or stiffness, loss of function, complex regional pain syndrome, failure of procedure, as appropriate.

## 2017-07-13 NOTE — OR Nursing (Signed)
Hospital interpreter present for admission

## 2017-07-13 NOTE — Op Note (Signed)
07/13/2017  5:38 PM  PATIENT:  Joel Adkins  76 y.o. male  PRE-OPERATIVE DIAGNOSIS:  AVASCULAR NECROSIS OF BONE LEFT HIP  POST-OPERATIVE DIAGNOSIS:  AVASCULAR NECROSIS OF BONE LEFT HIP  PROCEDURE:  Procedure(s): TOTAL HIP ARTHROPLASTY ANTERIOR APPROACH (Left)  SURGEON: Laurene Footman, MD  ASSISTANTS: None  ANESTHESIA:   spinal  EBL:  Total I/O In: 1000 [I.V.:1000] Out: 1100 [Urine:400; Blood:700]  BLOOD ADMINISTERED:none  DRAINS: none   LOCAL MEDICATIONS USED:  MARCAINE     SPECIMEN:  Source of Specimen:  Femoral head  DISPOSITION OF SPECIMEN:  PATHOLOGY  COUNTS:  YES  TOURNIQUET:  * No tourniquets in log *  IMPLANTS: Medacta AMIS 1 lateralized stem with 28 Mpact DM and liner with S 28 mm metal head  DICTATION: .Dragon Dictation   The patient was brought to the operating room and after spinal anesthesia was obtained patient was placed on the operative table with the ipsilateral foot into the Medacta attachment, contralateral leg on a well-padded table. C-arm was brought in and preop template x-ray taken. After prepping and draping in usual sterile fashion appropriate patient identification and timeout procedures were completed. Anterior approach to the hip was obtained and centered over the greater trochanter and TFL muscle. The subcutaneous tissue was incised hemostasis being achieved by electrocautery. TFL fascia was incised and the muscle retracted laterally deep retractor placed. The lateral femoral circumflex vessels were identified and ligated. The anterior capsule was exposed and a capsulotomy performed. The neck was identified and a femoral neck cut carried out with a saw. The head was removed without difficulty and showed sclerotic femoral head and acetabulum. Reaming was carried out to 54 mm and a 56 mm cup trial gave appropriate tightness to the acetabular component a 56 cup was impacted into position. The leg was then externally rotated and ischiofemoral and  pubofemoral releases carried out. The femur was sequentially broached to a size 1, size 1 standard width S head trials were placed and the final components chosen. The 1 lateralized stem was inserted along with a S 28 mm head and 56 mm liner. The hip was reduced and was stable the wound was thoroughly irrigated and fibrillar placed along the posterior capsule. The deep fascia was closed using a heavy Quill after infiltration of 30 cc of quarter percent Sensorcaine with epinephrine. 3-0 v-loc to close the skin with skin staples Xeroform and honeycomb dressing applied  PLAN OF CARE: Admit to inpatient

## 2017-07-13 NOTE — Anesthesia Procedure Notes (Signed)
Spinal  Patient location during procedure: OR Start time: 07/13/2017 3:26 PM End time: 07/13/2017 3:32 PM Staffing Anesthesiologist: Kephart, William K, MD Resident/CRNA: Peralta, Nicole, CRNA Performed: resident/CRNA  Preanesthetic Checklist Completed: patient identified, site marked, surgical consent, pre-op evaluation, timeout performed, IV checked, risks and benefits discussed and monitors and equipment checked Spinal Block Patient position: sitting Prep: ChloraPrep Patient monitoring: heart rate, continuous pulse ox, blood pressure and cardiac monitor Approach: midline Location: L4-5 Injection technique: single-shot Needle Needle type: Introducer and Pencan  Needle gauge: 24 G Needle length: 10 cm Assessment Sensory level: T6 Additional Notes Negative paresthesia. Negative blood return. Positive free-flowing CSF. Expiration date of kit checked and confirmed. Patient tolerated procedure well, without complications.       

## 2017-07-13 NOTE — Anesthesia Preprocedure Evaluation (Signed)
Anesthesia Evaluation  Patient identified by MRN, date of birth, ID band Patient awake    Reviewed: Allergy & Precautions, NPO status , Patient's Chart, lab work & pertinent test results  History of Anesthesia Complications Negative for: history of anesthetic complications  Airway Mallampati: III       Dental   Pulmonary neg sleep apnea, neg COPD, former smoker,           Cardiovascular (-) hypertension(-) Past MI and (-) CHF (-) pacemaker(-) Valvular Problems/Murmurs     Neuro/Psych neg Seizures    GI/Hepatic Neg liver ROS, neg GERD  ,  Endo/Other  neg diabetes  Renal/GU negative Renal ROS     Musculoskeletal   Abdominal   Peds  Hematology   Anesthesia Other Findings   Reproductive/Obstetrics                            Anesthesia Physical Anesthesia Plan  ASA: II  Anesthesia Plan: Spinal   Post-op Pain Management:    Induction: Intravenous  PONV Risk Score and Plan:   Airway Management Planned: Nasal Cannula  Additional Equipment:   Intra-op Plan:   Post-operative Plan:   Informed Consent: I have reviewed the patients History and Physical, chart, labs and discussed the procedure including the risks, benefits and alternatives for the proposed anesthesia with the patient or authorized representative who has indicated his/her understanding and acceptance.     Plan Discussed with:   Anesthesia Plan Comments:         Anesthesia Quick Evaluation

## 2017-07-13 NOTE — Anesthesia Post-op Follow-up Note (Signed)
Anesthesia QCDR form completed.        

## 2017-07-13 NOTE — Transfer of Care (Signed)
Immediate Anesthesia Transfer of Care Note  Patient: Joel Adkins  Procedure(s) Performed: TOTAL HIP ARTHROPLASTY ANTERIOR APPROACH (Left Hip)  Patient Location: PACU  Anesthesia Type:Spinal  Level of Consciousness: sedated  Airway & Oxygen Therapy: Patient Spontanous Breathing and Patient connected to face mask oxygen  Post-op Assessment: Report given to RN and Post -op Vital signs reviewed and stable  Post vital signs: Reviewed and stable  Last Vitals:  Vitals:   07/13/17 1223  BP: 137/66  Pulse: 79  Resp: 18  Temp: 36.6 C  SpO2: 96%    Last Pain:  Vitals:   07/13/17 1223  TempSrc: Temporal  PainSc: 5          Complications: No apparent anesthesia complications

## 2017-07-14 ENCOUNTER — Encounter: Payer: Self-pay | Admitting: Orthopedic Surgery

## 2017-07-14 LAB — CBC
HCT: 37.2 % — ABNORMAL LOW (ref 40.0–52.0)
Hemoglobin: 12.2 g/dL — ABNORMAL LOW (ref 13.0–18.0)
MCH: 27.6 pg (ref 26.0–34.0)
MCHC: 32.9 g/dL (ref 32.0–36.0)
MCV: 83.8 fL (ref 80.0–100.0)
PLATELETS: 225 10*3/uL (ref 150–440)
RBC: 4.44 MIL/uL (ref 4.40–5.90)
RDW: 14 % (ref 11.5–14.5)
WBC: 6.9 10*3/uL (ref 3.8–10.6)

## 2017-07-14 LAB — BASIC METABOLIC PANEL
ANION GAP: 8 (ref 5–15)
BUN: 19 mg/dL (ref 6–20)
CALCIUM: 7.7 mg/dL — AB (ref 8.9–10.3)
CO2: 23 mmol/L (ref 22–32)
Chloride: 100 mmol/L — ABNORMAL LOW (ref 101–111)
Creatinine, Ser: 1.01 mg/dL (ref 0.61–1.24)
Glucose, Bld: 122 mg/dL — ABNORMAL HIGH (ref 65–99)
Potassium: 4.4 mmol/L (ref 3.5–5.1)
Sodium: 131 mmol/L — ABNORMAL LOW (ref 135–145)

## 2017-07-14 MED ORDER — LACTULOSE 10 GM/15ML PO SOLN
10.0000 g | Freq: Once | ORAL | Status: AC
  Administered 2017-07-14: 10 g via ORAL
  Filled 2017-07-14: qty 30

## 2017-07-14 NOTE — Progress Notes (Signed)
   Subjective: 1 Day Post-Op Procedure(s) (LRB): TOTAL HIP ARTHROPLASTY ANTERIOR APPROACH (Left) Patient reports pain as 5 on 0-10 scale.   Patient is well, and has had no acute complaints or problems Denies any CP, SOB, ABD pain. We will continue therapy today.    Objective: Vital signs in last 24 hours: Temp:  [96.9 F (36.1 C)-100 F (37.8 C)] 100 F (37.8 C) (11/14 0753) Pulse Rate:  [64-114] 112 (11/14 0753) Resp:  [13-18] 18 (11/14 0753) BP: (102-140)/(51-75) 117/58 (11/14 0753) SpO2:  [93 %-100 %] 94 % (11/14 0753) Weight:  [68 kg (150 lb)] 68 kg (150 lb) (11/13 1223)  Intake/Output from previous day: 11/13 0701 - 11/14 0700 In: 2921.7 [I.V.:2761.7; IV Piggyback:160] Out: 4765 [Urine:925; Blood:700] Intake/Output this shift: No intake/output data recorded.  Recent Labs    07/14/17 0528  HGB 12.2*   Recent Labs    07/14/17 0528  WBC 6.9  RBC 4.44  HCT 37.2*  PLT 225   Recent Labs    07/14/17 0528  NA 131*  K 4.4  CL 100*  CO2 23  BUN 19  CREATININE 1.01  GLUCOSE 122*  CALCIUM 7.7*   No results for input(s): LABPT, INR in the last 72 hours.  EXAM General - Patient is Alert, Appropriate and Oriented Extremity - Neurovascular intact Sensation intact distally Intact pulses distally Dorsiflexion/Plantar flexion intact No cellulitis present Compartment soft Dressing - dressing C/D/I and scant drainage. Dressing with dry drainage. Mild bruising Motor Function - intact, moving foot and toes well on exam.   Past Medical History:  Diagnosis Date  . Medical history non-contributory     Assessment/Plan:   1 Day Post-Op Procedure(s) (LRB): TOTAL HIP ARTHROPLASTY ANTERIOR APPROACH (Left) Active Problems:   Other osteonecrosis, left femur (HCC)  Estimated body mass index is 26.57 kg/m as calculated from the following:   Height as of this encounter: 5\' 3"  (1.6 m).   Weight as of this encounter: 68 kg (150 lb). Advance diet Up with therapy   Needs BM Recheck labs in the am Continue to monitor HR, no CP/SOB/LE swelling CM to assist with discharge  DVT Prophylaxis - Lovenox, Foot Pumps and TED hose Weight-Bearing as tolerated to left leg   T. Rachelle Hora, PA-C [place] 07/14/2017, 8:05 AM

## 2017-07-14 NOTE — Care Management Note (Signed)
Case Management Note  Patient Details  Name: Joel Adkins MRN: [id number] Date of Birth: 06-29-1941  Subjective/Objective:                  Met with patient, physical therapist, and Spanish interpreter regarding transition of care and discharge planning. He plans to return home at discharge with transportation from a friend. His wife does not drive and will be home with him when he returns.  He states he does not have a walker and PT has recommended on.  He does not have preference of home health agencies and wants to use [facility] since his surgeon has recommended them.  Patient uses [facility] [phone]. Patient feels he will be able to administer Lovenox injections to himself at home but states no one has showed him how. Dr. Rudene Christians has requested that Lovenox/generic 12m injection daily for 14 days- no refills to be called in to patient's pharmacy.   Action/Plan:  Referral to [facility] for home health PT/spanish speaking.  [facility] for [facility]er. Lovenox called in to patient's pharmacy.     Expected Discharge Date:                  Expected Discharge Plan:     In-House Referral:     Discharge planning Services  CM Consult  Post Acute Care Choice:  Home Health, Durable Medical Equipment Choice offered to:  Patient  DME Arranged:  Walker rolling [facility] [facility]  Status of Service:  In process, will continue to follow  If discussed at Long Length of Stay Meetings, dates discussed:    Additional Comments:  AMarshell Garfinkel RN 07/14/2017, 2:26 PM

## 2017-07-14 NOTE — Progress Notes (Signed)
Physical Therapy Treatment Patient Details Name: Joel Adkins MRN: [id number] DOB: [birth year] Today's Date: 07/14/2017    History of Present Illness Pt admitted for L ant THR. Pt is Spanish speaking, used interpreter for session.    PT Comments    Pt is making good progress towards goals with improved gait distance this date. Pt ambulates with very slow gait speed. Pt educated on HEP and discussed frequency/duration. Spanish interpreter used for all interactions. Good endurance with there-ex, however fatigues with increased ambulation, several standing rest breaks noted. Will continue to progress.   Follow Up Recommendations  Home health PT     Equipment Recommendations  Rolling walker with 5" wheels    Recommendations for Other Services       Precautions / Restrictions Precautions Precautions: Fall;Anterior Hip Precaution Booklet Issued: Yes (comment) Restrictions Weight Bearing Restrictions: Yes LLE Weight Bearing: Weight bearing as tolerated    Mobility  Bed Mobility Overal bed mobility: Needs Assistance Bed Mobility: Supine to Sit     Supine to sit: Min assist     General bed mobility comments: not performed as pt received seated in recliner  Transfers Overall transfer level: Needs assistance Equipment used: Rolling walker (2 wheeled) Transfers: Sit to/from Stand Sit to Stand: Min guard         General transfer comment: transfers performed with safe technique with cues for correct hand placement  Ambulation/Gait Ambulation/Gait assistance: Min guard Ambulation Distance (Feet): 100 Feet Assistive device: Rolling walker (2 wheeled) Gait Pattern/deviations: Step-through pattern     General Gait Details: ambulated with step to gait pattern progressing to reciprocal gait pattern. Upright posture noted, however cues to keep RW close to body. Also needs cues to keep head up   Stairs            Wheelchair Mobility    Modified Rankin (Stroke  Patients Only)       Balance Overall balance assessment: Needs assistance Sitting-balance support: Feet supported;No upper extremity supported Sitting balance-Leahy Scale: Good     Standing balance support: Bilateral upper extremity supported Standing balance-Leahy Scale: Good                              Cognition Arousal/Alertness: Awake/alert Behavior During Therapy: WFL for tasks assessed/performed Overall Cognitive Status: Within Functional Limits for tasks assessed                                        Exercises Other Exercises Other Exercises: supine ther-ex performed x 10 reps on L LE including ankle pumps, quad sets, hip add squeezes, hip abd/add, and SAQ. All ther-ex performed with cga  Other Exercises: educated on HEP regarding frequency and duration    General Comments        Pertinent Vitals/Pain Pain Assessment: 0-10 Pain Score: 5  Pain Location: L hip Pain Descriptors / Indicators: Operative site guarding Pain Intervention(s): Patient requesting pain meds-RN notified;Limited activity within patient's tolerance;Ice applied    Home Living Family/patient expects to be discharged to:: Private residence Living Arrangements: Spouse/significant other Available Help at Discharge: Family;Available 24 hours/day Type of Home: House Home Access: Level entry   Home Layout: One level        Prior Function Level of Independence: Independent      Comments: no use of AD prior to admission   PT Goals (  current goals can now be found in the care plan section) Acute Rehab PT Goals Patient Stated Goal: to get stronger PT Goal Formulation: With patient Time For Goal Achievement: 07/28/17 Potential to Achieve Goals: Good Additional Goals Additional Goal #1: Pt will be able to perform bed mobility/transfers with supervision and safe technique using RW to improve functional independence Progress towards PT goals: Progressing toward  goals    Frequency    Min 2X/week      PT Plan Current plan remains appropriate    Co-evaluation              AM-PAC PT "6 Clicks" Daily Activity  Outcome Measure  Difficulty turning over in bed (including adjusting bedclothes, sheets and blankets)?: Unable Difficulty moving from lying on back to sitting on the side of the bed? : Unable Difficulty sitting down on and standing up from a chair with arms (e.g., wheelchair, bedside commode, etc,.)?: Unable Help needed moving to and from a bed to chair (including a wheelchair)?: A Little Help needed walking in hospital room?: A Little Help needed climbing 3-5 steps with a railing? : A Lot 6 Click Score: 11    End of Session Equipment Utilized During Treatment: Gait belt Activity Tolerance: Patient tolerated treatment well Patient left: in chair;with chair alarm set;with SCD's reapplied Nurse Communication: Mobility status PT Visit Diagnosis: Unsteadiness on feet (R26.81);Muscle weakness (generalized) (M62.81);Difficulty in walking, not elsewhere classified (R26.2);Pain Pain - Right/Left: Left Pain - part of body: Hip     Time: 1761-6073 PT Time Calculation (min) (ACUTE ONLY): 44 min  Charges:  $Gait Training: 23-37 mins $Therapeutic Exercise: 8-22 mins                    G Codes:  Functional Assessment Tool Used: AM-PAC 6 Clicks Basic Mobility Functional Limitation: Mobility: Walking and moving around Mobility: Walking and Moving Around Current Status (X1062): At least 60 percent but less than 80 percent impaired, limited or restricted Mobility: Walking and Moving Around Goal Status [phone]): At least 40 percent but less than 60 percent impaired, limited or restricted    Greggory Stallion, PT, DPT [phone]    Joel Adkins 07/14/2017, 3:41 PM

## 2017-07-14 NOTE — Evaluation (Signed)
Physical Therapy Evaluation Patient Details Name: Joel Adkins MRN: [id number] DOB: [birth year] Today's Date: 07/14/2017   History of Present Illness  Pt admitted for L ant THR. Pt is Spanish speaking, used interpreter for session.  Clinical Impression  Pt is a pleasant 76 year old [place] speaking male who was admitted for L anterior THR. Pt performs bed mobility with min assist, transfers with cga, and ambulation with cga and RW. Pt demonstrates deficits with strength/mobility/endurance. Pt appears motivated to perform therapy this date. Would benefit from skilled PT to address above deficits and promote optimal return to PLOF. Recommend transition to [place] upon discharge from acute hospitalization.       Follow Up Recommendations Home health PT    Equipment Recommendations  Rolling walker with 5" wheels    Recommendations for Other Services       Precautions / Restrictions Precautions Precautions: Fall;Anterior Hip Precaution Booklet Issued: No Restrictions Weight Bearing Restrictions: Yes LLE Weight Bearing: Weight bearing as tolerated      Mobility  Bed Mobility Overal bed mobility: Needs Assistance Bed Mobility: Supine to Sit     Supine to sit: Min assist     General bed mobility comments: assist for sliding out towards EOB. Once seated at EOB, dizziness noted, cleared within a minute  Transfers Overall transfer level: Needs assistance Equipment used: Rolling walker (2 wheeled) Transfers: Sit to/from Stand Sit to Stand: Min guard         General transfer comment: upright posture noted with RW. Cues for pushing from seated surface  Ambulation/Gait Ambulation/Gait assistance: Min guard Ambulation Distance (Feet): 50 Feet Assistive device: Rolling walker (2 wheeled) Gait Pattern/deviations: Step-to pattern     General Gait Details: ambulated with slow gait speed and step to gait pattern. Cues to keep RW close to body  Stairs             Wheelchair Mobility    Modified Rankin (Stroke Patients Only)       Balance Overall balance assessment: Needs assistance Sitting-balance support: Feet supported;No upper extremity supported Sitting balance-Leahy Scale: Good     Standing balance support: Bilateral upper extremity supported Standing balance-Leahy Scale: Good                               Pertinent Vitals/Pain Pain Assessment: 0-10 Pain Score: 5  Pain Location: L hip, decreased to 2/10 with exertion Pain Descriptors / Indicators: Operative site guarding Pain Intervention(s): Limited activity within patient's tolerance;Premedicated before session;Repositioned;Ice applied    Home Living Family/patient expects to be discharged to:: Private residence Living Arrangements: Spouse/significant other Available Help at Discharge: Family;Available 24 hours/day Type of Home: House Home Access: Level entry     Home Layout: One level        Prior Function Level of Independence: Independent         Comments: no use of AD prior to admission     Hand Dominance        Extremity/Trunk Assessment   Upper Extremity Assessment Upper Extremity Assessment: Overall WFL for tasks assessed    Lower Extremity Assessment Lower Extremity Assessment: Generalized weakness(L LE grossly 3/5; R LE grossly 4+/5)       Communication   Communication: No difficulties  Cognition Arousal/Alertness: Awake/alert Behavior During Therapy: WFL for tasks assessed/performed Overall Cognitive Status: Within Functional Limits for tasks assessed  General Comments      Exercises Other Exercises Other Exercises: supine ther-ex performed x 10 reps on L LE including ankle pumps, quad sets, glut sets, and hip abd/add. All ther-ex performed with cga and cues for correct technique   Assessment/Plan    PT Assessment Patient needs continued PT services  PT Problem  List Decreased strength;Decreased activity tolerance;Decreased balance;Decreased mobility;Pain;Decreased knowledge of use of DME       PT Treatment Interventions DME instruction;Gait training;Stair training;Therapeutic exercise;Balance training    PT Goals (Current goals can be found in the Care Plan section)  Acute Rehab PT Goals Patient Stated Goal: to get stronger PT Goal Formulation: With patient Time For Goal Achievement: 07/28/17 Potential to Achieve Goals: Good    Frequency Min 2X/week   Barriers to discharge        Co-evaluation               AM-PAC PT "6 Clicks" Daily Activity  Outcome Measure Difficulty turning over in bed (including adjusting bedclothes, sheets and blankets)?: Unable Difficulty moving from lying on back to sitting on the side of the bed? : Unable Difficulty sitting down on and standing up from a chair with arms (e.g., wheelchair, bedside commode, etc,.)?: Unable Help needed moving to and from a bed to chair (including a wheelchair)?: A Little Help needed walking in hospital room?: A Little Help needed climbing 3-5 steps with a railing? : A Lot 6 Click Score: 11    End of Session Equipment Utilized During Treatment: Gait belt Activity Tolerance: Patient tolerated treatment well Patient left: in chair;with chair alarm set;with SCD's reapplied Nurse Communication: Mobility status PT Visit Diagnosis: Unsteadiness on feet (R26.81);Muscle weakness (generalized) (M62.81);Difficulty in walking, not elsewhere classified (R26.2);Pain Pain - Right/Left: Left Pain - part of body: Hip    Time: 1093-2355 PT Time Calculation (min) (ACUTE ONLY): 29 min   Charges:   PT Evaluation $PT Eval Low Complexity: 1 Low PT Treatments $Therapeutic Exercise: 8-22 mins   PT G Codes:   PT G-Codes **NOT FOR INPATIENT CLASS** Functional Assessment Tool Used: AM-PAC 6 Clicks Basic Mobility Functional Limitation: Mobility: Walking and moving around Mobility:  Walking and Moving Around Current Status (D3220): At least 60 percent but less than 80 percent impaired, limited or restricted Mobility: Walking and Moving Around Goal Status [phone]): At least 40 percent but less than 60 percent impaired, limited or restricted    Greggory Stallion, PT, DPT [phone]   Darnesha Diloreto 07/14/2017, 1:17 PM

## 2017-07-14 NOTE — Anesthesia Postprocedure Evaluation (Signed)
Anesthesia Post Note  Patient: [name]  Procedure(s) Performed: TOTAL HIP ARTHROPLASTY ANTERIOR APPROACH (Left Hip)  Patient location during evaluation: Nursing Unit Anesthesia Type: Spinal Level of consciousness: oriented, awake and alert and patient cooperative Pain management: pain level controlled Vital Signs Assessment: post-procedure vital signs reviewed and stable Respiratory status: spontaneous breathing and respiratory function stable Cardiovascular status: blood pressure returned to baseline and stable Postop Assessment: no headache, no backache and no apparent nausea or vomiting Anesthetic complications: no     Last Vitals:  Vitals:   07/14/17 0301 07/14/17 0753  BP: 126/70 (!) 117/58  Pulse: (!) 114 (!) 112  Resp:  18  Temp:  37.8 C  SpO2: 96% 94%    Last Pain:  Vitals:   07/14/17 0753  TempSrc: Oral  PainSc:                  Virgilio Frees

## 2017-07-14 NOTE — NC FL2 (Signed)
[place] LEVEL OF CARE SCREENING TOOL     IDENTIFICATION  Patient Name: Joel Adkins Birthdate: Aug 19, 1941 Sex: male Admission Date (Current Location): 07/13/2017  [place] and [place] Number:  Engineering geologist and Address:  [facility], [address] [place], [place] [address]      Provider Number: [id number]  Attending Physician Name and Address:  Hessie Knows, MD  Relative Name and Phone Number:       Current Level of Care: Hospital Recommended Level of Care: [place] Prior Approval Number:    Date Approved/Denied:   PASRR Number: [id number])  Discharge Plan: SNF    Current Diagnoses: Patient Active Problem List   Diagnosis Date Noted  . 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 )                       Personal Care Assistance Level of Assistance  Bathing, Feeding, Dressing Bathing Assistance: Limited assistance Feeding assistance: Independent Dressing Assistance: Limited assistance     Functional Limitations Info  Sight, Hearing, Speech Sight Info: Adequate Hearing Info: Adequate Speech Info: Adequate    SPECIAL CARE FACTORS FREQUENCY  PT (By licensed PT), OT (By licensed OT)     PT Frequency: (5) OT Frequency: (5)            Contractures      Additional Factors Info  Code Status, Allergies Code Status Info: (Full Code. ) Allergies Info: (No Known Allergies. )           Current Medications (07/14/2017):  This is the current hospital active medication list Current Facility-Administered Medications   Medication Dose Route Frequency Provider Last Rate Last Dose  . 0.9 %  sodium chloride infusion   Intravenous Continuous Hessie Knows, MD 100 mL/hr at 07/13/17 2051    . acetaminophen (TYLENOL) tablet 650 mg  650 mg Oral Q4H PRN Hessie Knows, MD       Or  . acetaminophen (TYLENOL) suppository 650 mg  650 mg Rectal Q4H PRN Hessie Knows, MD      . bisacodyl (DULCOLAX) suppository 10 mg  10 mg Rectal Daily PRN Hessie Knows, MD      . ceFAZolin (ANCEF) 1 g in dextrose 5 % 50 mL injection   Intravenous Q6H Hessie Knows, MD   Stopped at 07/14/17 0340  . diphenhydrAMINE (BENADRYL) 12.5 MG/5ML elixir 12.5-25 mg  12.5-25 mg Oral Q4H PRN Hessie Knows, MD      . docusate sodium (COLACE) capsule 100 mg  100 mg Oral BID Hessie Knows, MD   100 mg at 07/13/17 2049  . enoxaparin (LOVENOX) injection 40 mg  40 mg Subcutaneous Q24H Hessie Knows, MD      . ferrous [id number] C-folic acid (TRINSICON / FOLTRIN) capsule 1 capsule  1 capsule Oral BID PC Hessie Knows, MD      . gabapentin (NEURONTIN) capsule 100 mg  100 mg Oral QHS Hessie Knows, MD   100 mg at 07/13/17 2049  . hydroxypropyl methylcellulose / hypromellose (ISOPTO TEARS / GONIOVISC) 2.5 % ophthalmic solution 1-2 drop  1-2 drop  Both Eyes TID PRN Hessie Knows, MD      . magnesium citrate solution 1 Bottle  1 Bottle Oral Once PRN Hessie Knows, MD      . magnesium hydroxide (MILK OF MAGNESIA) suspension 30 mL  30 mL Oral Daily PRN Hessie Knows, MD      . menthol-cetylpyridinium (CEPACOL) lozenge 3 mg  1 lozenge Oral PRN Hessie Knows, MD       Or  . phenol (CHLORASEPTIC) mouth spray 1 spray  1 spray Mouth/Throat PRN Hessie Knows, MD      . methocarbamol (ROBAXIN) tablet 500 mg  500 mg Oral Q6H PRN Hessie Knows, MD       Or  . methocarbamol (ROBAXIN) 500 mg in dextrose 5 % 50 mL IVPB  500 mg Intravenous Q6H PRN Hessie Knows, MD      . metoCLOPramide (REGLAN) tablet 5-10 mg  5-10 mg Oral Q8H PRN Hessie Knows, MD       Or  .  metoCLOPramide (REGLAN) injection 5-10 mg  5-10 mg Intravenous Q8H PRN Hessie Knows, MD      . morphine 2 MG/ML injection 2 mg  2 mg Intravenous Q1H PRN Hessie Knows, MD   2 mg at 07/13/17 2049  . ondansetron (ZOFRAN) tablet 4 mg  4 mg Oral Q6H PRN Hessie Knows, MD       Or  . ondansetron [facility]) injection 4 mg  4 mg Intravenous Q6H PRN Hessie Knows, MD      . oxyCODONE (Oxy IR/ROXICODONE) immediate release tablet 10 mg  10 mg Oral Q3H PRN Hessie Knows, MD   10 mg at 07/14/17 0402  . oxyCODONE (Oxy IR/ROXICODONE) immediate release tablet 5 mg  5 mg Oral Q3H PRN Hessie Knows, MD   5 mg at 07/13/17 2012  . zolpidem (AMBIEN) tablet 5 mg  5 mg Oral QHS PRN Hessie Knows, MD       Facility-Administered Medications Ordered in Other Encounters  Medication Dose Route Frequency Provider Last Rate Last Dose  . bupivacaine (MARCAINE) 0.5 % injection   Other Anesthesia Intra-op Dionne Bucy, CRNA   3 mL at 07/13/17 1532  . ceFAZolin (ANCEF) IVPB 2 g/50 mL premix   Intravenous Anesthesia Intra-op Dionne Bucy, CRNA   2 g at 07/13/17 1538  . fentaNYL (SUBLIMAZE) injection    Anesthesia Intra-op Dionne Bucy, CRNA   25 mcg at 07/13/17 1522  . midazolam (VERSED) 5 MG/5ML injection    Anesthesia Intra-op Dionne Bucy, CRNA   1 mg at 07/13/17 1522  . phenylephrine (NEO-SYNEPHRINE) 100 mcg/mL in sodium chloride 0.9 % 100 mL infusion   Intravenous Continuous PRN Dionne Bucy, CRNA 12 mL/hr at 07/13/17 1731 20 mcg/min at 07/13/17 1731  . phenylephrine (NEO-SYNEPHRINE) injection   Intravenous Anesthesia Intra-op Dionne Bucy, CRNA   200 mcg at 07/13/17 1558  . propofol (DIPRIVAN) 10 mg/mL bolus/IV push    Anesthesia Intra-op Dionne Bucy, CRNA   30 mg at 07/13/17 1538  . propofol (DIPRIVAN) 500 MG/50ML infusion    Continuous PRN Dionne Bucy, CRNA   Stopped at 07/13/17 1733  . tranexamic acid (CYKLOKAPRON) 1,000 mg in sodium chloride 0.9 % 100 mL IVPB   Intravenous Continuous PRN Dionne Bucy, CRNA   1,000 mg at 07/13/17 1544     Discharge Medications: Please see discharge summary for a list of discharge medications.  Relevant Imaging Results:  Relevant Lab Results:   Additional Information (SSN: [id number])  Kyvon Hu, Veronia Beets, LCSW

## 2017-07-14 NOTE — Progress Notes (Signed)
Clinical Social Worker (CSW) received SNF consult. PT is recommending home health. RN case manager aware of above. Please reconsult if future social work needs arise. CSW signing off.   Ethyn Schetter, LCSW [phone] 

## 2017-07-15 LAB — CBC
HEMATOCRIT: 38.1 % — AB (ref 40.0–52.0)
HEMOGLOBIN: 12.7 g/dL — AB (ref 13.0–18.0)
MCH: 27.6 pg (ref 26.0–34.0)
MCHC: 33.2 g/dL (ref 32.0–36.0)
MCV: 83.2 fL (ref 80.0–100.0)
Platelets: 221 10*3/uL (ref 150–440)
RBC: 4.58 MIL/uL (ref 4.40–5.90)
RDW: 14.3 % (ref 11.5–14.5)
WBC: 7.9 10*3/uL (ref 3.8–10.6)

## 2017-07-15 LAB — BASIC METABOLIC PANEL
Anion gap: 8 (ref 5–15)
BUN: 21 mg/dL — AB (ref 6–20)
CO2: 26 mmol/L (ref 22–32)
CREATININE: 1.24 mg/dL (ref 0.61–1.24)
Calcium: 8.1 mg/dL — ABNORMAL LOW (ref 8.9–10.3)
Chloride: 96 mmol/L — ABNORMAL LOW (ref 101–111)
GFR calc Af Amer: >60 mL/min (ref >60)
GFR calc non Af Amer: 54 mL/min — ABNORMAL LOW (ref >60)
Glucose, Bld: 134 mg/dL — ABNORMAL HIGH (ref 65–99)
Potassium: 4.5 mmol/L (ref 3.5–5.1)
SODIUM: 130 mmol/L — AB (ref 135–145)

## 2017-07-15 LAB — SURGICAL PATHOLOGY

## 2017-07-15 NOTE — Progress Notes (Signed)
Physical Therapy Treatment Patient Details Name: Joel Adkins MRN: [id number] DOB: 1941-01-15 Today's Date: 07/15/2017    History of Present Illness Pt admitted for L ant THR. Pt is Spanish speaking, used interpreter for session.    PT Comments    Pt is making good progress towards goals, although does ambulate at slow gait speed, only averaging 10' walk in 35 seconds. Pt continues to be motivated to perform therapy and educated in HEP, reviewing there-ex. Interpreter used over the phone for session. Pt is safe to dc from PT standpoint, possible dc today pending BM per PA notes. Will continue to follow.   Follow Up Recommendations  Home health PT     Equipment Recommendations  Rolling walker with 5" wheels    Recommendations for Other Services       Precautions / Restrictions Precautions Precautions: Fall;Anterior Hip Precaution Booklet Issued: Yes (comment) Restrictions Weight Bearing Restrictions: Yes LLE Weight Bearing: Weight bearing as tolerated    Mobility  Bed Mobility Overal bed mobility: Needs Assistance Bed Mobility: Supine to Sit     Supine to sit: Min guard     General bed mobility comments: not performed as pt received seated in recliner  Transfers Overall transfer level: Needs assistance Equipment used: Rolling walker (2 wheeled) Transfers: Sit to/from Stand Sit to Stand: Min guard         General transfer comment: transfers performed with safe technique with cues for correct hand placement  Ambulation/Gait Ambulation/Gait assistance: Min guard Ambulation Distance (Feet): 150 Feet Assistive device: Rolling walker (2 wheeled) Gait Pattern/deviations: Step-through pattern     General Gait Details: ambulated very slow gait speed, completing 10' walk time in 35 seconds. Still needs cues for RW sequencing and keeping correct distance. Fatigues quickly with exertion, needed 2 seated rest breaks. Reciprocal gait performed. Second assist for  chair follow.   Stairs            Wheelchair Mobility    Modified Rankin (Stroke Patients Only)       Balance Overall balance assessment: Needs assistance Sitting-balance support: Feet supported;No upper extremity supported Sitting balance-Leahy Scale: Good     Standing balance support: Bilateral upper extremity supported Standing balance-Leahy Scale: Good                              Cognition Arousal/Alertness: Awake/alert Behavior During Therapy: WFL for tasks assessed/performed Overall Cognitive Status: Within Functional Limits for tasks assessed                                        Exercises Other Exercises Other Exercises: seated ther-ex performed on L LE including ankle pumps, quad sets, hip abd/add, and SAQ. All ther-ex performed x 15 reps on L LE with cga.    General Comments        Pertinent Vitals/Pain Pain Assessment: 0-10 Pain Score: 5  Pain Location: L hip Pain Descriptors / Indicators: Operative site guarding Pain Intervention(s): Limited activity within patient's tolerance;Ice applied;Premedicated before session    [place] expects to be discharged to:: Private residence Living Arrangements: Spouse/significant other Available Help at Discharge: Family;Available 24 hours/day Type of Home: House Home Access: Level entry   Home Layout: One level Home Equipment: Grab bars - tub/shower      Prior Function Level of Independence: Independent  Comments: no use of AD prior to admission. 1 fall    PT Goals (current goals can now be found in the care plan section) Acute Rehab PT Goals Patient Stated Goal: to get stronger PT Goal Formulation: With patient Time For Goal Achievement: 07/28/17 Potential to Achieve Goals: Good Progress towards PT goals: Progressing toward goals    Frequency    BID      PT Plan Current plan remains appropriate    Co-evaluation               AM-PAC PT "6 Clicks" Daily Activity  Outcome Measure  Difficulty turning over in bed (including adjusting bedclothes, sheets and blankets)?: Unable Difficulty moving from lying on back to sitting on the side of the bed? : Unable Difficulty sitting down on and standing up from a chair with arms (e.g., wheelchair, bedside commode, etc,.)?: Unable Help needed moving to and from a bed to chair (including a wheelchair)?: None Help needed walking in hospital room?: None Help needed climbing 3-5 steps with a railing? : A Little 6 Click Score: 14    End of Session Equipment Utilized During Treatment: Gait belt Activity Tolerance: Patient tolerated treatment well Patient left: in chair;with chair alarm set;with SCD's reapplied Nurse Communication: Mobility status PT Visit Diagnosis: Unsteadiness on feet (R26.81);Muscle weakness (generalized) (M62.81);Difficulty in walking, not elsewhere classified (R26.2);Pain Pain - Right/Left: Left Pain - part of body: Hip     Time: 1002-1047 PT Time Calculation (min) (ACUTE ONLY): 45 min  Charges:  $Gait Training: 23-37 mins $Therapeutic Exercise: 8-22 mins                    G Codes:  Functional Assessment Tool Used: AM-PAC 6 Clicks Basic Mobility Functional Limitation: Mobility: Walking and moving around Mobility: Walking and Moving Around Current Status (Q2863): At least 40 percent but less than 60 percent impaired, limited or restricted Mobility: Walking and Moving Around Goal Status [phone]): At least 20 percent but less than 40 percent impaired, limited or restricted    Greggory Stallion, PT, DPT [phone]    Leigh Kaeding 07/15/2017, 11:53 AM

## 2017-07-15 NOTE — Plan of Care (Signed)
Progressing Education: Knowledge of General Education information will improve 07/15/2017 0615 - Progressing by Keisa Blow, Lucille Passy, RN Health Behavior/Discharge Planning: Ability to manage health-related needs will improve 07/15/2017 0615 - Progressing by Hennesy Sobalvarro, Lucille Passy, RN Clinical Measurements: Ability to maintain clinical measurements within normal limits will improve 07/15/2017 0615 - Progressing by Mirren Gest, Lucille Passy, RN Will remain free from infection 07/15/2017 0615 - Progressing by Abrina Petz, Lucille Passy, RN Diagnostic test results will improve 07/15/2017 0615 - Progressing by Kamrin Sibley, Lucille Passy, RN Respiratory complications will improve 07/15/2017 0615 - Progressing by Bryna Colander, RN Cardiovascular complication will be avoided 07/15/2017 0615 - Progressing by Anouk Critzer, Lucille Passy, RN Activity: Risk for activity intolerance will decrease 07/15/2017 0615 - Progressing by Bryna Colander, RN Nutrition: Adequate nutrition will be maintained 07/15/2017 0615 - Progressing by Bryna Colander, RN Coping: Level of anxiety will decrease 07/15/2017 0615 - Progressing by Bryna Colander, RN Elimination: Will not experience complications related to bowel motility 07/15/2017 0615 - Progressing by Jayshun Galentine, Lucille Passy, RN Will not experience complications related to urinary retention 07/15/2017 0615 - Progressing by Kemon Devincenzi, Lucille Passy, RN Pain Managment: General experience of comfort will improve 07/15/2017 0615 - Progressing by Tashiba Timoney, Lucille Passy, RN Safety: Ability to remain free from injury will improve 07/15/2017 0615 - Progressing by Angel Hobdy, Lucille Passy, RN Skin Integrity: Risk for impaired skin integrity will decrease 07/15/2017 0615 - Progressing by Bryna Colander, RN Spiritual Needs Ability to function at adequate level 07/15/2017 0615 - Progressing by Camyla Camposano, Lucille Passy, RN Education: Knowledge of the prescribed therapeutic regimen  will improve 07/15/2017 0615 - Progressing by Jurline Folger, Lucille Passy, RN Understanding of discharge needs will improve 07/15/2017 0615 - Progressing by Isabel Ardila, Lucille Passy, RN Activity: Ability to avoid complications of mobility impairment will improve 07/15/2017 0615 - Progressing by Octaviano Mukai, Lucille Passy, RN Ability to tolerate increased activity will improve 07/15/2017 0615 - Progressing by Bryna Colander, RN Clinical Measurements: Postoperative complications will be avoided or minimized 07/15/2017 0615 - Progressing by Bryna Colander, RN Pain Management: Pain level will decrease with appropriate interventions 07/15/2017 0615 - Progressing by Bryna Colander, RN Skin Integrity: Signs of wound healing will improve 07/15/2017 0615 - Progressing by Bryna Colander, RN Education: Knowledge of the prescribed therapeutic regimen will improve 07/15/2017 0615 - Progressing by Gerilynn Mccullars, Lucille Passy, RN Understanding of discharge needs will improve 07/15/2017 0615 - Progressing by Shamal Stracener, Lucille Passy, RN Activity: Ability to avoid complications of mobility impairment will improve 07/15/2017 0615 - Progressing by Bryna Colander, RN Ability to tolerate increased activity will improve 07/15/2017 0615 - Progressing by Bryna Colander, RN Clinical Measurements: Postoperative complications will be avoided or minimized 07/15/2017 0615 - Progressing by Bryna Colander, RN Pain Management: Pain level will decrease with appropriate interventions 07/15/2017 0615 - Progressing by Bryna Colander, RN Skin Integrity: Signs of wound healing will improve 07/15/2017 0615 - Progressing by Bryna Colander, RN Education: Knowledge of the prescribed therapeutic regimen will improve 07/15/2017 0615 - Progressing by Yvette Loveless, Lucille Passy, RN Understanding of discharge needs will improve 07/15/2017 0615 - Progressing by Angelina Neece, Lucille Passy, RN Activity: Ability to avoid  complications of mobility impairment will improve 07/15/2017 0615 - Progressing by Bryna Colander, RN Ability to tolerate increased activity will improve 07/15/2017 0615 - Progressing by Bryna Colander, RN Clinical Measurements: Postoperative complications will be avoided or minimized 07/15/2017 0615 - Progressing by Bryna Colander, RN Pain Management: Pain level will decrease  with appropriate interventions 07/15/2017 0615 - Progressing by Blaise Grieshaber, Lucille Passy, RN Skin Integrity: Signs of wound healing will improve 07/15/2017 0615 - Progressing by Aylen Rambert, Lucille Passy, RN

## 2017-07-15 NOTE — Plan of Care (Signed)
  Education: Knowledge of the prescribed therapeutic regimen will improve 07/15/2017 1836 by Lawrence Santiago, Lin Givens, RN Note Patient was educated on Lovenox administration. Patient verbalizes understanding. Patient refuses to self administer this time but is willing to try it tomorrow.

## 2017-07-15 NOTE — Care Management (Addendum)
Lovenox $ 1.25. Rolling walker has been delivered.

## 2017-07-15 NOTE — Progress Notes (Signed)
Physical Therapy Treatment Patient Details Name: ZAKARIYA KNICKERBOCKER MRN: [id number] DOB: 08-10-1941 Today's Date: 07/15/2017    History of Present Illness Pt admitted for L ant THR. Pt is Spanish speaking, used interpreter for session.    PT Comments    Pt is making good progress towards goals with improved gait distance and speed this session. Good endurance with there-ex. Pt motivated to perform therapy. Has met goals for discharge. He does not have stairs, lives in entry level apartment. Will continue to progress.   Follow Up Recommendations  Home health PT     Equipment Recommendations  Rolling walker with 5" wheels    Recommendations for Other Services       Precautions / Restrictions Precautions Precautions: Fall;Anterior Hip Precaution Booklet Issued: Yes (comment) Restrictions Weight Bearing Restrictions: Yes LLE Weight Bearing: Weight bearing as tolerated    Mobility  Bed Mobility               General bed mobility comments: not performed as pt received seated in recliner  Transfers Overall transfer level: Needs assistance Equipment used: Rolling walker (2 wheeled) Transfers: Sit to/from Stand Sit to Stand: Supervision         General transfer comment: improved technique. Upright posture noted  Ambulation/Gait Ambulation/Gait assistance: Min guard Ambulation Distance (Feet): 250 Feet Assistive device: Rolling walker (2 wheeled) Gait Pattern/deviations: Step-through pattern     General Gait Details: ambulated with improved speed this session able to ambulate 10' walk in 18 seconds. Reciprocal gait pattern performed with safe technique. DId not require any seated rest breaks this session, only 1 standing break. No chair follow required   Stairs            Wheelchair Mobility    Modified Rankin (Stroke Patients Only)       Balance                                            Cognition Arousal/Alertness:  Awake/alert Behavior During Therapy: WFL for tasks assessed/performed Overall Cognitive Status: Within Functional Limits for tasks assessed                                        Exercises Other Exercises Other Exercises: seated ther-ex performed on L LE including ankle pumps, quad sets, glut sets, hip abd/add, and SAQ. All ther-ex performed x 12 reps on L LE with cga Other Exercises: educated on HEP regarding frequency and duration    General Comments        Pertinent Vitals/Pain Pain Assessment: 0-10 Pain Score: 7  Pain Location: L hip Pain Descriptors / Indicators: Operative site guarding Pain Intervention(s): Limited activity within patient's tolerance;Ice applied;Repositioned    Home Living                      Prior Function            PT Goals (current goals can now be found in the care plan section) Acute Rehab PT Goals Patient Stated Goal: to get stronger PT Goal Formulation: With patient Time For Goal Achievement: 07/28/17 Potential to Achieve Goals: Good Progress towards PT goals: Progressing toward goals    Frequency    BID      PT Plan Current plan remains appropriate  Co-evaluation              AM-PAC PT "6 Clicks" Daily Activity  Outcome Measure  Difficulty turning over in bed (including adjusting bedclothes, sheets and blankets)?: Unable Difficulty moving from lying on back to sitting on the side of the bed? : Unable Difficulty sitting down on and standing up from a chair with arms (e.g., wheelchair, bedside commode, etc,.)?: None Help needed moving to and from a bed to chair (including a wheelchair)?: None Help needed walking in hospital room?: None Help needed climbing 3-5 steps with a railing? : A Little 6 Click Score: 17    End of Session Equipment Utilized During Treatment: Gait belt Activity Tolerance: Patient tolerated treatment well Patient left: in chair;with chair alarm set;with SCD's  reapplied Nurse Communication: Mobility status PT Visit Diagnosis: Unsteadiness on feet (R26.81);Muscle weakness (generalized) (M62.81);Difficulty in walking, not elsewhere classified (R26.2);Pain Pain - Right/Left: Left Pain - part of body: Hip     Time: 1412-1450 PT Time Calculation (min) (ACUTE ONLY): 38 min  Charges:  $Gait Training: 23-37 mins $Therapeutic Exercise: 8-22 mins                    G Codes:  Functional Assessment Tool Used: AM-PAC 6 Clicks Basic Mobility Functional Limitation: Mobility: Walking and moving around Mobility: Walking and Moving Around Current Status (B0211): At least 40 percent but less than 60 percent impaired, limited or restricted Mobility: Walking and Moving Around Goal Status [phone]): At least 20 percent but less than 40 percent impaired, limited or restricted    Greggory Stallion, PT, DPT [phone]    Tanessa Tidd 07/15/2017, 5:31 PM

## 2017-07-15 NOTE — Progress Notes (Addendum)
   Subjective: 2 Days Post-Op Procedure(s) (LRB): TOTAL HIP ARTHROPLASTY ANTERIOR APPROACH (Left) Patient reports pain as 4 on 0-10 scale.   Patient is well, and has had no acute complaints or problems Denies any coughing, CP, SOB, ABD pain. We will continue therapy today.    Objective: Vital signs in last 24 hours: Temp:  [98.8 F (37.1 C)-100 F (37.8 C)] 99.2 F (37.3 C) (11/14 1945) Pulse Rate:  [111-115] 115 (11/14 1945) Resp:  [17-20] 17 (11/14 1945) BP: (117-134)/(58-64) 134/64 (11/14 1945) SpO2:  [92 %-94 %] 92 % (11/14 1945)  Intake/Output from previous day: 11/14 0701 - 11/15 0700 In: 1545 [P.O.:960; I.V.:585] Out: 850 [Urine:850] Intake/Output this shift: No intake/output data recorded.  Recent Labs    07/14/17 0528 07/15/17 0352  HGB 12.2* 12.7*   Recent Labs    07/14/17 0528 07/15/17 0352  WBC 6.9 7.9  RBC 4.44 4.58  HCT 37.2* 38.1*  PLT 225 221   Recent Labs    07/14/17 0528 07/15/17 0352  NA 131* 130*  K 4.4 4.5  CL 100* 96*  CO2 23 26  BUN 19 21*  CREATININE 1.01 1.24  GLUCOSE 122* 134*  CALCIUM 7.7* 8.1*   No results for input(s): LABPT, INR in the last 72 hours.  EXAM General - Patient is Alert, Appropriate and Oriented Extremity - Neurovascular intact Sensation intact distally Intact pulses distally Dorsiflexion/Plantar flexion intact No cellulitis present Compartment soft  - homans bilaterally Dressing - dressing C/D/I and scant drainage. Dressing changed Motor Function - intact, moving foot and toes well on exam.   Past Medical History:  Diagnosis Date  . Medical history non-contributory     Assessment/Plan:   2 Days Post-Op Procedure(s) (LRB): TOTAL HIP ARTHROPLASTY ANTERIOR APPROACH (Left) Active Problems:   Other osteonecrosis, left femur (HCC)  Estimated body mass index is 26.57 kg/m as calculated from the following:   Height as of this encounter: 5\' 3"  (1.6 m).   Weight as of this encounter: 68 kg (150  lb). Advance diet Up with therapy  Needs BM Plan on discharge to home with home health PT tomorrow.  No family at home today.    DVT Prophylaxis - Lovenox, Foot Pumps and TED hose Weight-Bearing as tolerated to left leg   T. Rachelle Hora, PA-C [facility] 07/15/2017, 7:08 AM

## 2017-07-15 NOTE — Evaluation (Signed)
Occupational Therapy Evaluation Patient Details Name: Joel Adkins MRN: [id number] DOB: [birth year] Today's Date: 07/15/2017    History of Present Illness Pt admitted for L ant THR. Pt is Spanish speaking, used interpreter for session.   Clinical Impression   OT evaluation completed this date with use of telephone interpreter services, interpreter [phone]. Pt is 76 year old male s/p L ant THR who lives at home with his spouse. Pt was independent in all ADLs prior to surgery and is eager to return to PLOF.  Pt is currently limited in functional ADLs due to pain and decreased ROM/strength in LLE. Pt generally min assist for LB ADL tasks, educated in AE/DME for bathing, dressing, toileting needs. Also educated in compression stocking mgt and falls prevention. Pt verbalized understanding of all education/training provided. No additional skilled OT needs at this time. Do recommend 3:1 for use over low commode at home for toileting needs to maximize safety and functional independence.      Follow Up Recommendations  No OT follow up    Equipment Recommendations  3 in 1 bedside commode    Recommendations for Other Services       Precautions / Restrictions Precautions Precautions: Fall;Anterior Hip Restrictions Weight Bearing Restrictions: Yes LLE Weight Bearing: Weight bearing as tolerated      Mobility Bed Mobility Overal bed mobility: Needs Assistance Bed Mobility: Supine to Sit     Supine to sit: Min guard        Transfers Overall transfer level: Needs assistance Equipment used: Rolling walker (2 wheeled) Transfers: Sit to/from Stand Sit to Stand: Min guard         General transfer comment: transfers performed with safe technique with cues for correct hand placement    Balance Overall balance assessment: Needs assistance Sitting-balance support: Feet supported;No upper extremity supported Sitting balance-Leahy Scale: Good     Standing balance support:  Bilateral upper extremity supported Standing balance-Leahy Scale: Good                             ADL either performed or assessed with clinical judgement   ADL Overall ADL's : Needs assistance/impaired                                       General ADL Comments: Pt generally min assist for LB ADL tasks, educated in AE/DME for bathing, dressing, toileting needs. Also educated in compression stocking mgt and falls prevention. Pt verbalized understanding of all education/training provided.     Vision Baseline Vision/History: Wears glasses Wears Glasses: Reading only Patient Visual Report: No change from baseline       Perception     Praxis      Pertinent Vitals/Pain Pain Assessment: 0-10 Pain Score: 5  Pain Location: L hip Pain Descriptors / Indicators: Operative site guarding Pain Intervention(s): Limited activity within patient's tolerance;Monitored during session;Premedicated before session;Repositioned;Ice applied     Hand Dominance     Extremity/Trunk Assessment Upper Extremity Assessment Upper Extremity Assessment: Overall WFL for tasks assessed   Lower Extremity Assessment Lower Extremity Assessment: Generalized weakness;Defer to PT evaluation   Cervical / Trunk Assessment Cervical / Trunk Assessment: Normal   Communication Communication Communication: No difficulties   Cognition Arousal/Alertness: Awake/alert Behavior During Therapy: WFL for tasks assessed/performed Overall Cognitive Status: Within Functional Limits for tasks assessed  General Comments       Exercises     Shoulder Instructions      Home Living Family/patient expects to be discharged to:: Private residence Living Arrangements: Spouse/significant other Available Help at Discharge: Family;Available 24 hours/day Type of Home: House Home Access: Level entry     Home Layout: One level     Bathroom  Shower/Tub: Teacher, early years/pre: Standard     Home Equipment: Grab bars - tub/shower          Prior Functioning/Environment Level of Independence: Independent        Comments: no use of AD prior to admission. 1 fall         OT Problem List:        OT Treatment/Interventions:      OT Goals(Current goals can be found in the care plan section) Acute Rehab OT Goals Patient Stated Goal: to get stronger OT Goal Formulation: All assessment and education complete, DC therapy  OT Frequency:     Barriers to D/C:            Co-evaluation              AM-PAC PT "6 Clicks" Daily Activity     Outcome Measure Help from another person eating meals?: None Help from another person taking care of personal grooming?: None Help from another person toileting, which includes using toliet, bedpan, or urinal?: A Little Help from another person bathing (including washing, rinsing, drying)?: A Little Help from another person to put on and taking off regular upper body clothing?: None Help from another person to put on and taking off regular lower body clothing?: A Little 6 Click Score: 21   End of Session Equipment Utilized During Treatment: Gait belt;Rolling walker  Activity Tolerance: Patient tolerated treatment well Patient left: in chair;with call bell/phone within reach;with chair alarm set  OT Visit Diagnosis: Other abnormalities of gait and mobility (R26.89)                Time: 0937-1000 OT Time Calculation (min): 23 min Charges:  OT General Charges $OT Visit: 1 Visit OT Evaluation $OT Eval Low Complexity: 1 Low OT Treatments $Self Care/Home Management : 8-22 mins G-Codes: OT G-codes **NOT FOR INPATIENT CLASS** Functional Assessment Tool Used: AM-PAC 6 Clicks Daily Activity;Clinical judgement Functional Limitation: Self care Self Care Current Status (K1601): At least 1 percent but less than 20 percent impaired, limited or restricted Self Care Goal  Status (U9323): At least 1 percent but less than 20 percent impaired, limited or restricted Self Care Discharge Status [phone]): At least 1 percent but less than 20 percent impaired, limited or restricted   Jeni Salles, MPH, MS, OTR/L ascom [phone] 07/15/17, 10:18 AM

## 2017-07-16 MED ORDER — DOCUSATE SODIUM 100 MG PO CAPS: 100.0000 mg | ORAL_CAPSULE | Freq: Two times a day (BID) | ORAL | 0 refills | Status: DC

## 2017-07-16 MED ORDER — ENOXAPARIN SODIUM 40 MG/0.4ML ~~LOC~~ SOLN: 40.0000 mg | SUBCUTANEOUS | 0 refills | Status: DC

## 2017-07-16 MED ORDER — OXYCODONE HCL 5 MG PO TABS: 5.0000 mg | ORAL_TABLET | ORAL | 0 refills | Status: DC | PRN

## 2017-07-16 NOTE — Care Management (Signed)
RNCM has notified [facility] that patient will discharge to home today. Rolling was delivered.

## 2017-07-16 NOTE — Discharge Summary (Signed)
Physician Discharge Summary  Patient ID: Joel Adkins MRN: [id number] DOB/AGE: 05-16-1941 76 y.o.  Admit date: 07/13/2017 Discharge date: 07/16/2017  Admission Diagnoses:  AVASCULAR NECROSIS OF BONE LEFT HIP   Discharge Diagnoses: Patient Active Problem List   Diagnosis Date Noted  . Other osteonecrosis, left femur ([place]) 07/13/2017    Past Medical History:  Diagnosis Date  . Medical history non-contributory      Transfusion: none   Consultants (if any):   Discharged Condition: Improved  Hospital Course: Joel Adkins is an 76 y.o. male who was admitted 07/13/2017 with a diagnosis of  Left hip avascular necrosis and went to the operating room on 07/13/2017 and underwent the above named procedures.    Surgeries: Procedure(s): TOTAL HIP ARTHROPLASTY ANTERIOR APPROACH on 07/13/2017 Patient tolerated the surgery well. Taken to PACU where she was stabilized and then transferred to the orthopedic floor.  Started on Lovenox 40 q 24 hrs. Foot pumps applied bilaterally at 80 mm. Heels elevated on bed with rolled towels. No evidence of DVT. Negative [place]oman. Physical therapy started on day #1 for gait training and transfer. OT started day #1 for ADL and assisted devices.  Patient's foley was d/c on day #1. Patient's IV was d/c on day #2.  On post op day #3 patient was stable and ready for discharge to home with HHPT.  Implants: Medacta AMIS 1 lateralized stem with 96 Mpact DM and liner with S 28 mm metal head    He was given perioperative antibiotics:  Anti-infectives (From admission, onward)   Start     Dose/Rate Route Frequency Ordered Stop   07/13/17 2000  ceFAZolin (ANCEF) 1 g in dextrose 5 % 50 mL injection     100 mL/hr over 30 Minutes Intravenous Every 6 hours 07/13/17 1948 07/14/17 0956   07/13/17 1945  ceFAZolin (ANCEF) IVPB 1 g/50 mL premix  Status:  Discontinued     1 g 100 mL/hr over 30 Minutes Intravenous Every 6 hours 07/13/17 1931 07/13/17 1948    07/13/17 1251  ceFAZolin (ANCEF) 2-4 GM/100ML-% IVPB    Comments:  Dewayne Hatch   : cabinet override      07/13/17 1251 07/14/17 0059   07/13/17 0230  ceFAZolin (ANCEF) IVPB 2g/100 mL premix  Status:  Discontinued     2 g 200 mL/hr over 30 Minutes Intravenous  Once 07/13/17 0219 07/13/17 1917    .  He was given sequential compression devices, early ambulation, and Lovenox for DVT prophylaxis.  He benefited maximally from the hospital stay and there were no complications.    Recent vital signs:  Vitals:   07/15/17 1624 07/15/17 2024  BP: 131/65 121/63  Pulse: (!) 124 (!) 111  Resp:  18  Temp: (!) 97.4 F (36.3 C) 98.7 F (37.1 C)  SpO2: 93% 95%    Recent laboratory studies:  Lab Results  Component Value Date   HGB 12.7 (L) 07/15/2017   HGB 12.2 (L) 07/14/2017   HGB 14.7 06/15/2017   Lab Results  Component Value Date   WBC 7.9 07/15/2017   PLT 221 07/15/2017   Lab Results  Component Value Date   INR 0.95 06/15/2017   Lab Results  Component Value Date   NA 130 (L) 07/15/2017   K 4.5 07/15/2017   CL 96 (L) 07/15/2017   CO2 26 07/15/2017   BUN 21 (H) 07/15/2017   CREATININE 1.24 07/15/2017   GLUCOSE 134 (H) 07/15/2017    Discharge Medications:   Allergies  as of 07/16/2017   No Known Allergies     Medication List    TAKE these medications   [facility] DRY EYE RELIEF 0.2-0.2-1 % Soln Generic drug:  Glycerin-Hypromellose-PEG 400 Place 1-2 drops into both eyes 3 (three) times daily as needed (for dry eyes.).   diclofenac 75 MG EC tablet Commonly known as:  VOLTAREN Take 75 mg by mouth 2 (two) times daily as needed (for pain.).   docusate sodium 100 MG capsule Commonly known as:  COLACE Take 1 capsule (100 mg total) 2 (two) times daily by mouth.   enoxaparin 40 MG/0.4ML injection Commonly known as:  LOVENOX Inject 0.4 mLs (40 mg total) daily into the skin.   gabapentin 100 MG capsule Commonly known as:  NEURONTIN Take 100 mg by mouth at bedtime.    oxyCODONE 5 MG immediate release tablet Commonly known as:  Oxy IR/ROXICODONE Take 1-2 tablets (5-10 mg total) every 4 (four) hours as needed by mouth for moderate pain ((score 4 to 6)).            Durable Medical Equipment  (From admission, onward)        Start     Ordered   07/15/17 [phone]  For home use only DME Walker rolling  Once    Question:  Patient needs a walker to treat with the following condition  Answer:  Difficulty walking   07/15/17 0832      Diagnostic Studies: Dg Hip Operative Unilat W Or W/o Pelvis Left  Result Date: 07/13/2017 CLINICAL DATA:  Left hip replacement EXAM: OPERATIVE left HIP (WITH PELVIS IF PERFORMED) 4 VIEWS TECHNIQUE: Fluoroscopic spot image(s) were submitted for interpretation post-operatively. COMPARISON:  05/26/2017 FINDINGS: Four low resolution intraoperative spot views of the left hip are submitted. Images are obtained during operative course of left hip replacement. Final image demonstrates satisfactory alignment. Total fluoroscopy time was 30 seconds. IMPRESSION: Intraoperative fluoroscopic assistance provided during left hip surgery Electronically Signed   By: Donavan Foil M.D.   On: 07/13/2017 19:20   Dg Hip Unilat W Or W/o Pelvis 2-3 Views Left  Result Date: 07/13/2017 CLINICAL DATA:  Status post left hip replacement EXAM: DG HIP (WITH OR WITHOUT PELVIS) 2-3V LEFT COMPARISON:  CT 05/26/2017 FINDINGS: Status post left hip replacement with normal alignment. No fracture. Cutaneous staples. IMPRESSION: Status post left hip replacement with expected surgical changes Electronically Signed   By: Donavan Foil M.D.   On: 07/13/2017 18:19    Disposition:     Follow-up Information    Hessie Knows, MD Follow up in 2 week(s).   Specialty:  Orthopedic Surgery Contact information: [address] [place] [address] [phone]            Signed: Dorise Hiss [facility] 07/16/2017, 7:54  AM

## 2017-07-16 NOTE — Progress Notes (Signed)
Discharge instructions and medication details reviewed with patient. Pt verbalizes understanding. Printed prescriptions for oxycodone and Lovenox given to patient. Patient's IV was removed. Patient was escorted out via wheelchair.

## 2017-07-16 NOTE — Progress Notes (Signed)
   Subjective: 3 Days Post-Op Procedure(s) (LRB): TOTAL HIP ARTHROPLASTY ANTERIOR APPROACH (Left) Patient reports pain as 4 on 0-10 scale.   Patient is well, and has had no acute complaints or problems Denies any coughing, CP, SOB, ABD pain. We will continue therapy today.    Objective: Vital signs in last 24 hours: Temp:  [97.4 F (36.3 C)-98.7 F (37.1 C)] 98.7 F (37.1 C) (11/15 2024) Pulse Rate:  [111-124] 111 (11/15 2024) Resp:  [18] 18 (11/15 2024) BP: (121-131)/(63-65) 121/63 (11/15 2024) SpO2:  [93 %-95 %] 95 % (11/15 2024)  Intake/Output from previous day: 11/15 0701 - 11/16 0700 In: 720 [P.O.:720] Out: 200 [Urine:200] Intake/Output this shift: No intake/output data recorded.  Recent Labs    07/14/17 0528 07/15/17 0352  HGB 12.2* 12.7*   Recent Labs    07/14/17 0528 07/15/17 0352  WBC 6.9 7.9  RBC 4.44 4.58  HCT 37.2* 38.1*  PLT 225 221   Recent Labs    07/14/17 0528 07/15/17 0352  NA 131* 130*  K 4.4 4.5  CL 100* 96*  CO2 23 26  BUN 19 21*  CREATININE 1.01 1.24  GLUCOSE 122* 134*  CALCIUM 7.7* 8.1*   No results for input(s): LABPT, INR in the last 72 hours.  EXAM General - Patient is Alert, Appropriate and Oriented Extremity - Neurovascular intact Sensation intact distally Intact pulses distally Dorsiflexion/Plantar flexion intact No cellulitis present Compartment soft  - homans bilaterally Dressing - dressing C/D/I and scant drainage. Dressing changed Motor Function - intact, moving foot and toes well on exam.   Past Medical History:  Diagnosis Date  . Medical history non-contributory     Assessment/Plan:   3 Days Post-Op Procedure(s) (LRB): TOTAL HIP ARTHROPLASTY ANTERIOR APPROACH (Left) Active Problems:   Other osteonecrosis, left femur ([facility])  Estimated body mass index is 26.57 kg/m as calculated from the following:   Height as of this encounter: 5\' 3"  (1.6 m).   Weight as of this encounter: 68 kg (150 lb). Advance  diet Up with therapy  Plan on discharge to home with home health PT today.   Follow up with [facility] in 2 weeks for staple removal   DVT Prophylaxis - Lovenox, Foot Pumps and TED hose Weight-Bearing as tolerated to left leg   T. Rachelle Hora, PA-C [facility] 07/16/2017, 7:52 AM

## 2017-07-16 NOTE — Discharge Instructions (Signed)

## 2017-07-16 NOTE — Progress Notes (Signed)
Physical Therapy Treatment Patient Details Name: Joel Adkins MRN: [id number] DOB: 1941-06-07 Today's Date: 07/16/2017    History of Present Illness Pt admitted for L ant THR. Pt is Spanish speaking, used interpreter for session.    PT Comments    Improved mobility noted this session with fluid gait pattern. Still ambulates with slow speed, however progressing from previous sessions. Safe with RW. Reviewed HEP with pt. Pt continues to be motivated to perform therapy. Pt has met all goals. Will dc from PT.   Follow Up Recommendations  Home health PT     Equipment Recommendations  Rolling walker with 5" wheels    Recommendations for Other Services       Precautions / Restrictions Precautions Precautions: Fall;Anterior Hip Precaution Booklet Issued: Yes (comment) Restrictions Weight Bearing Restrictions: Yes LLE Weight Bearing: Weight bearing as tolerated    Mobility  Bed Mobility               General bed mobility comments: not performed as pt received seated in recliner  Transfers Overall transfer level: Needs assistance Equipment used: Rolling walker (2 wheeled) Transfers: Sit to/from Stand Sit to Stand: Supervision         General transfer comment: Safe technique using RW  Ambulation/Gait Ambulation/Gait assistance: Min guard Ambulation Distance (Feet): 250 Feet Assistive device: Rolling walker (2 wheeled) Gait Pattern/deviations: Step-through pattern     General Gait Details: ambulated with improved fluid gait pattern, able to keep RW moving and demonstrating reciprocal and equal step length. Improved 10' walk time in 16 seconds this session. No standing rest breaks noted.   Stairs            Wheelchair Mobility    Modified Rankin (Stroke Patients Only)       Balance                                            Cognition Arousal/Alertness: Awake/alert Behavior During Therapy: WFL for tasks  assessed/performed Overall Cognitive Status: Within Functional Limits for tasks assessed                                        Exercises Other Exercises Other Exercises: seated ther-ex performed on L LE including ankle pumps, quad sets, glut sets, SAQ, hip abd/add, and LAQ. All ther-ex perforemd x 15 reps with cga.    General Comments        Pertinent Vitals/Pain Pain Assessment: 0-10 Pain Score: 5  Pain Location: L hip Pain Descriptors / Indicators: Operative site guarding Pain Intervention(s): Limited activity within patient's tolerance;Premedicated before session;Repositioned;Ice applied    Home Living                      Prior Function            PT Goals (current goals can now be found in the care plan section) Acute Rehab PT Goals Patient Stated Goal: to get stronger PT Goal Formulation: With patient Time For Goal Achievement: 07/28/17 Potential to Achieve Goals: Good Progress towards PT goals: Progressing toward goals    Frequency    BID      PT Plan Current plan remains appropriate    Co-evaluation  AM-PAC PT "6 Clicks" Daily Activity  Outcome Measure  Difficulty turning over in bed (including adjusting bedclothes, sheets and blankets)?: Unable Difficulty moving from lying on back to sitting on the side of the bed? : Unable Difficulty sitting down on and standing up from a chair with arms (e.g., wheelchair, bedside commode, etc,.)?: None Help needed moving to and from a bed to chair (including a wheelchair)?: None Help needed walking in hospital room?: None Help needed climbing 3-5 steps with a railing? : A Little 6 Click Score: 17    End of Session Equipment Utilized During Treatment: Gait belt Activity Tolerance: Patient tolerated treatment well Patient left: in chair;with chair alarm set;with SCD's reapplied Nurse Communication: Mobility status PT Visit Diagnosis: Unsteadiness on feet (R26.81);Muscle  weakness (generalized) (M62.81);Difficulty in walking, not elsewhere classified (R26.2);Pain Pain - Right/Left: Left Pain - part of body: Hip     Time: 1040-1107 PT Time Calculation (min) (ACUTE ONLY): 27 min  Charges:  $Gait Training: 8-22 mins $Therapeutic Exercise: 8-22 mins                    G Codes:  Functional Assessment Tool Used: AM-PAC 6 Clicks Basic Mobility Functional Limitation: Mobility: Walking and moving around Mobility: Walking and Moving Around Current Status (P0141): At least 40 percent but less than 60 percent impaired, limited or restricted Mobility: Walking and Moving Around Goal Status [phone]): At least 20 percent but less than 40 percent impaired, limited or restricted    Greggory Stallion, PT, DPT [phone]    Joel Adkins 07/16/2017, 1:58 PM

## 2017-12-28 IMAGING — XA DG HIP (WITH PELVIS) OPERATIVE*L*
4 series · 4 of 4 positions shown · non-contrast
Comparison: 05/26/2017

CLINICAL DATA: Left hip replacement

EXAM:
OPERATIVE left HIP (WITH PELVIS IF PERFORMED) 4 VIEWS
TECHNIQUE: Fluoroscopic spot image(s) were submitted for interpretation
post-operatively.

[Series 1: ortho standard · 1 of 1 slices shown (1 of 4)]
[im 1/1]
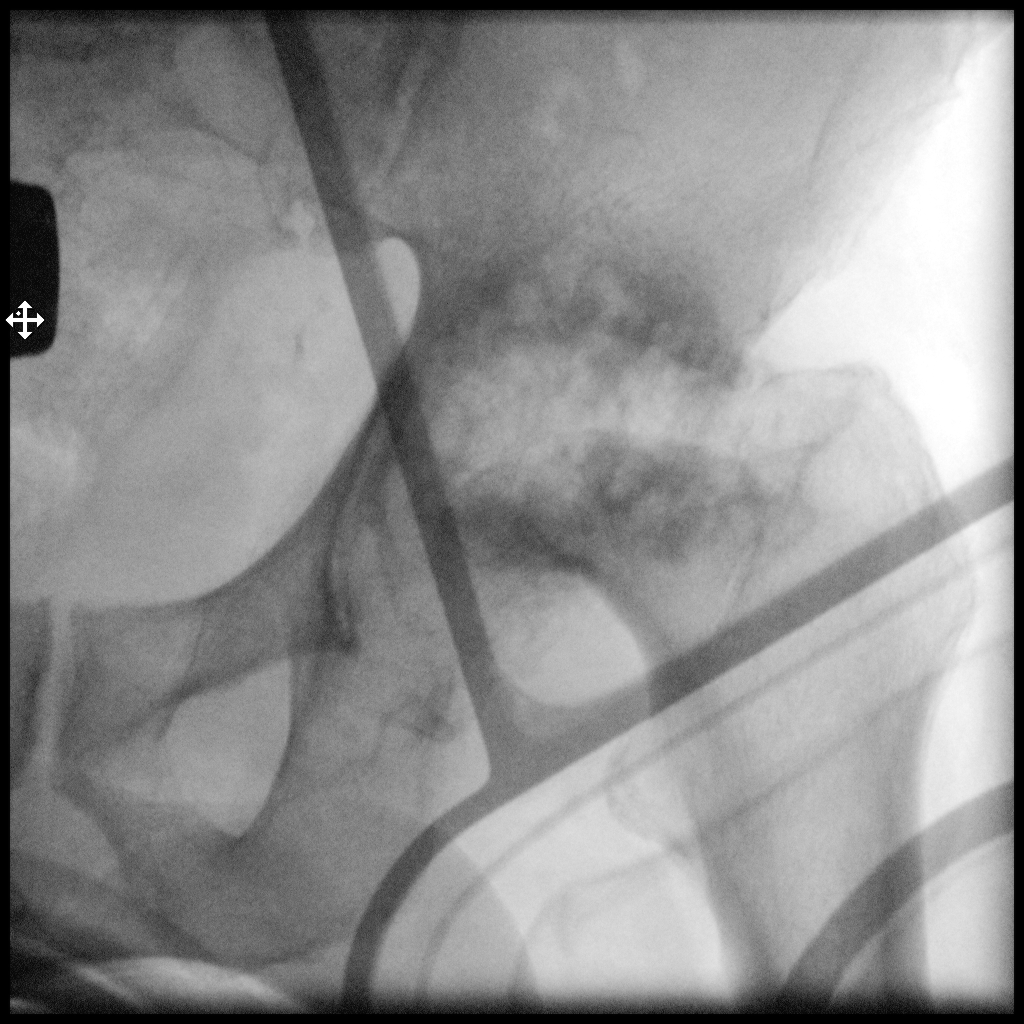

[Series 3: ortho standard · 1 of 1 slices shown (2 of 4)]
[im 1/1]
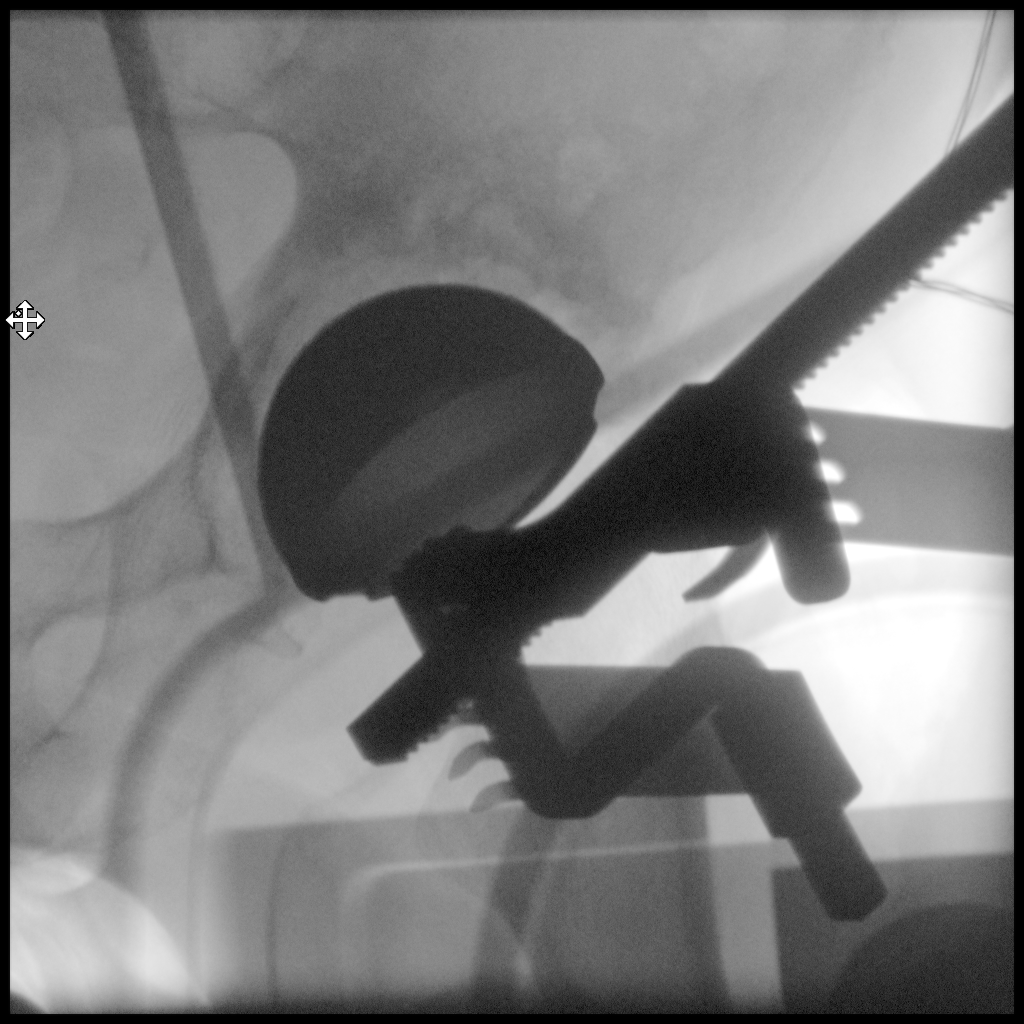

[Series 5: ortho standard · 1 of 1 slices shown (3 of 4)]
[im 1/1]
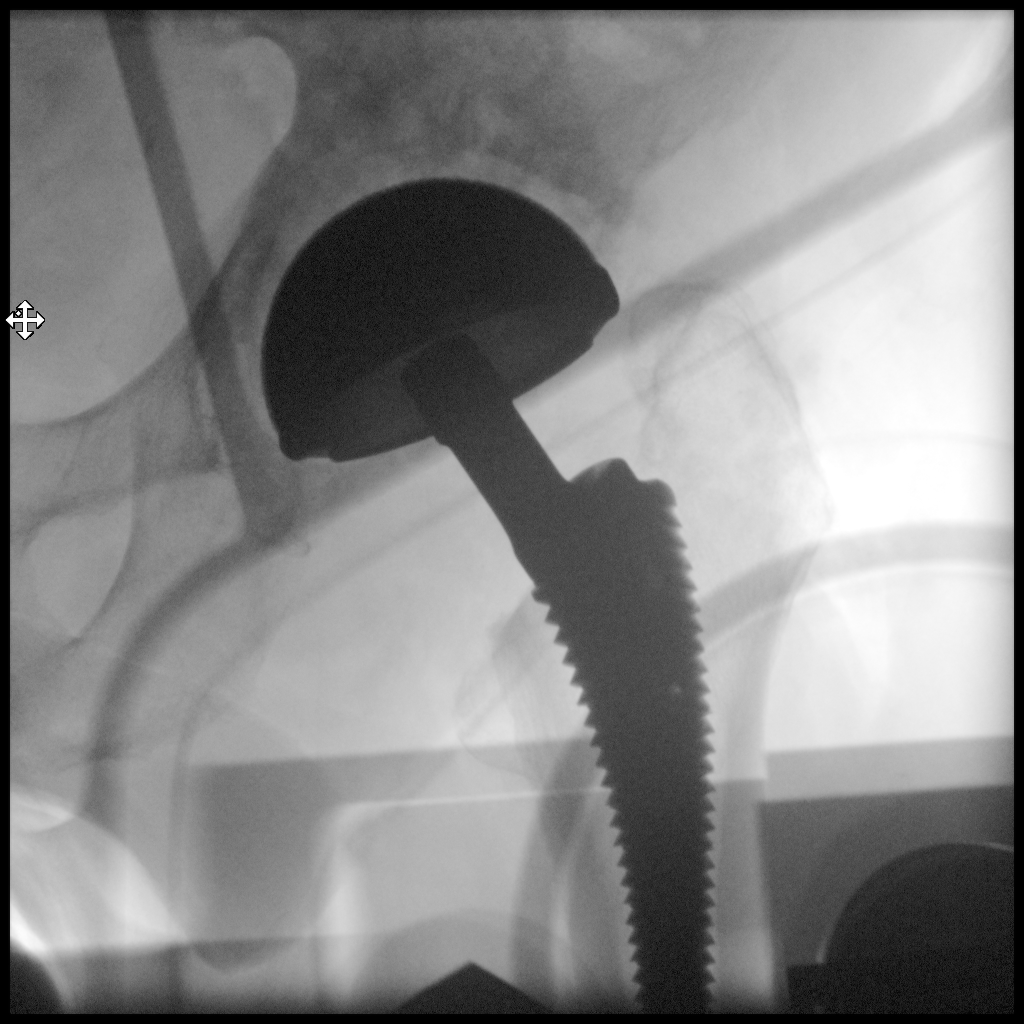

[Series 9: ortho standard · 1 of 1 slices shown (4 of 4)]
[im 1/1]
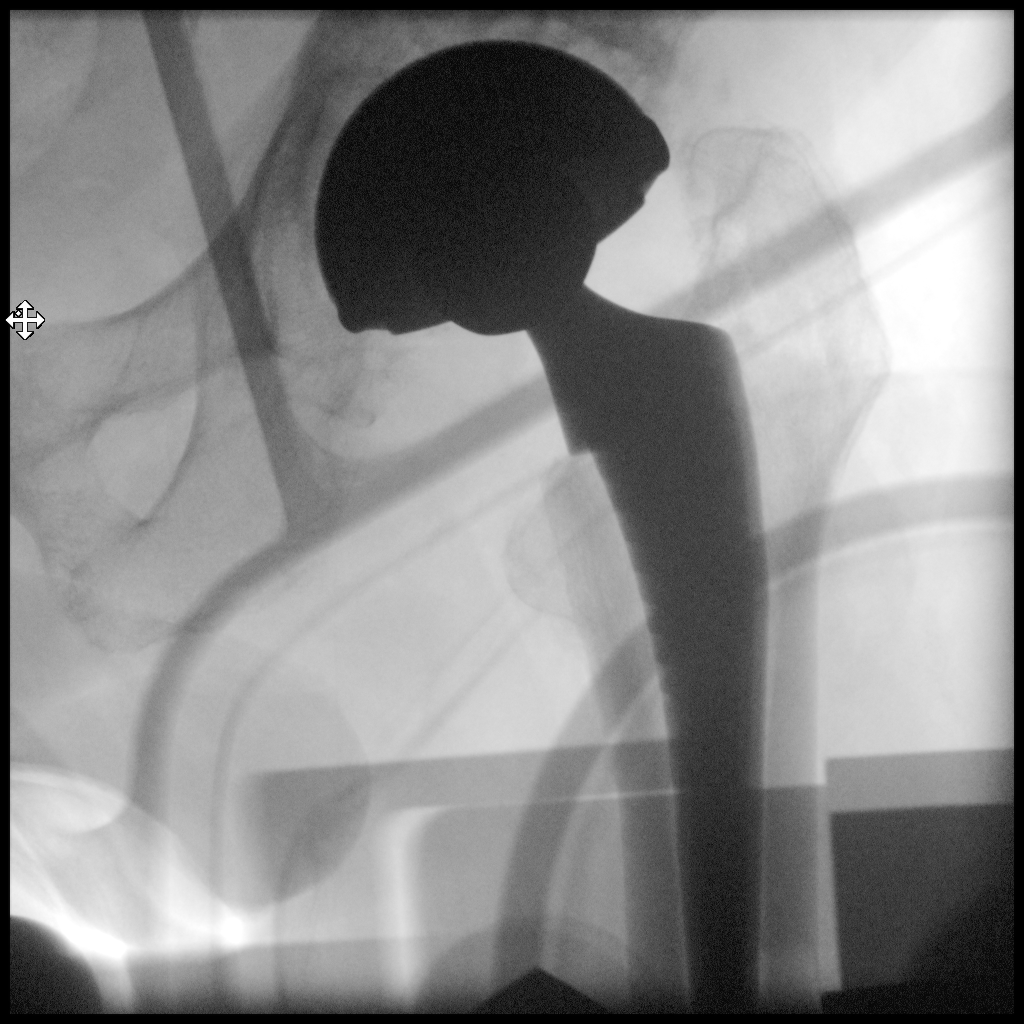

[4 of 4 positions shown; findings below may reference images not displayed]

FINDINGS: Four low resolution intraoperative spot views of the left hip are
submitted. Images are obtained during operative course of left hip
replacement. Final image demonstrates satisfactory alignment. Total
fluoroscopy time was 30 seconds.
IMPRESSION: Intraoperative fluoroscopic assistance provided during left hip
surgery

## 2019-04-06 ENCOUNTER — Ambulatory Visit: Payer: Medicare HMO | Attending: Internal Medicine | Admitting: Internal Medicine

## 2019-04-06 ENCOUNTER — Encounter: Payer: Self-pay | Admitting: Internal Medicine

## 2019-04-06 ENCOUNTER — Other Ambulatory Visit: Payer: Self-pay

## 2019-04-06 VITALS — BP 125/84 | HR 86 | Temp 98.0°F | Resp 16 | Ht 64.0 in | Wt 159.6 lb

## 2019-04-06 DIAGNOSIS — N400 Enlarged prostate without lower urinary tract symptoms: Secondary | ICD-10-CM | POA: Insufficient documentation

## 2019-04-06 DIAGNOSIS — R030 Elevated blood-pressure reading, without diagnosis of hypertension: Secondary | ICD-10-CM

## 2019-04-06 DIAGNOSIS — H5203 Hypermetropia, bilateral: Secondary | ICD-10-CM

## 2019-04-06 DIAGNOSIS — E785 Hyperlipidemia, unspecified: Secondary | ICD-10-CM | POA: Insufficient documentation

## 2019-04-06 DIAGNOSIS — Z23 Encounter for immunization: Secondary | ICD-10-CM

## 2019-04-06 DIAGNOSIS — L918 Other hypertrophic disorders of the skin: Secondary | ICD-10-CM | POA: Insufficient documentation

## 2019-04-06 NOTE — Patient Instructions (Signed)
Vacuna Td (contra el ttanos y la difteria): lo que debe saber Td Vaccine (Tetanus and Diphtheria): What You Need to Know 1. Por qu vacunarse? El ttanos y la difteria son enfermedades muy graves. Son Futures trader frecuentes en los [place] actualmente, pero las personas que se infectan suelen tener complicaciones graves. La vacuna Td se [place] para proteger a los adolescentes y a los adultos de ambas enfermedades. Tanto el ttanos como la difteria son infecciones causadas por bacterias. La difteria se transmite de persona a persona a travs de la tos o el estornudo. Las bacterias que causan el ttanos entran en el cuerpo a travs de cortes, raspones o heridas. El TTANOS (trismo) provoca entumecimiento y Writer dolorosa de los msculos, por lo general, en todo el cuerpo.  Puede causar el endurecimiento de los msculos de la cabeza y el cuello, de modo que impide abrir la boca, tragar y, en algunos casos, incluso respirar. El ttanos es causa de muerte en aproximadamente 1de cada 10personas que contraen la infeccin, incluso despus de que reciben la mejor atencin mdica. La DIFTERIA puede hacer que se forme una membrana gruesa en la parte posterior de la garganta.  Puede causar problemas respiratorios, parlisis, insuficiencia cardaca e incluso la muerte. Antes de las vacunas, en los [place] se informaban 200000 casos de difteria y cientos de casos de ttanos cada ao. Desde que comenz la vacunacin, los informes de casos de ambas enfermedades se han reducido en un 99%. 2. Edward Jolly Td La vacuna Td puede proteger a adolescentes y adultos contra el ttanos y la difteria. La vacuna Td habitualmente se aplica como dosis de refuerzo cada 10aos, pero tambin puede administrarse antes si la persona sufre una [place] o herida sucia y grave. A veces, en lugar de la vacuna Td, se recomienda una vacuna llamada Tdap, que protege contra la tos [place], adems de proteger contra el ttanos y la  difteria. El mdico o la persona que le aplique la vacuna puede darle ms informacin al [employer]. La vacuna Td puede administrarse de manera segura simultneamente con otras vacunas. 3. Algunas personas no deben recibir esta vacuna  Una persona que alguna vez ha tenido una reaccin alrgica potencialmente mortal a una dosis anterior de cualquier vacuna contra el ttanos o la difteria, O que tenga una alergia grave a cualquier parte de esta vacuna, no debe recibir la vacuna Td. Informe a la persona que le aplica la vacuna si tiene cualquier alergia grave.  Consulte con su mdico si: ? tuvo hinchazn o dolor intenso despus de recibir cualquier vacuna contra la difteria o el ttanos, ? alguna vez ha sufrido el sndrome de Curator (SGB), ? no se siente Pharmacologist en que se ha programado la vacuna. 4. Riesgos de [place] reaccin a la vacuna Con cualquier medicamento, incluso las vacunas, existe la posibilidad de que aparezcan efectos secundarios. Suelen ser leves y desaparecen por s solos. Si bien es posible tener [place] graves, estas son Orlene Erm raras. [place] personas a las que se les aplica la vacuna Td no tienen ningn problema. Problemas leves despus de la vacuna Td: (No interfirieron en las actividades)  Management consultant donde se aplic la vacuna (alrededor de 8de cada 10personas)  Enrojecimiento o Estate agent donde se aplic la vacuna (alrededor de 1de cada 4personas)  Cristy Hilts leve (poco frecuente)  Dolor de Pensions consultant (alrededor de 1de cada 4personas)  Cansancio (alrededor de 1de cada 4personas) Problemas moderados despus de la vacuna  Td: (Interfirieron en las actividades, pero no exigieron atencin mdica)  Fiebre superior a 102F (39C) (poco frecuente) Problemas graves despus de la vacuna Td: (Impidieron realizar las actividades habituales y exigieron atencin mdica)  Hinchazn, dolor intenso, sangrado o enrojecimiento en el brazo en que se  aplic la vacuna (poco frecuente). Problemas que podran ocurrir despus de cualquier vacuna:  A veces, las personas se desmayan despus de un procedimiento mdico, incluida la vacunacin. Permanecer sentado o recostado durante 15minutos puede ayudar a evitar los desmayos y las lesiones causadas por las cadas. Informe al mdico si se siente mareado, tiene cambios en la visin o zumbidos en los odos.  Algunas personas sienten un dolor intenso en el hombro y tienen dificultad para mover el brazo donde se aplic la vacuna. Esto sucede con muy poca frecuencia.  Cualquier medicamento puede causar una reaccin alrgica grave. Dichas reacciones son muy poco frecuentes con una vacuna (se calcula que menos de 1en un milln de dosis) y se producen entre unos minutos y unas horas despus de la vacunacin. Al igual que con cualquier medicamento, existe una probabilidad muy remota de que una vacuna cause una lesin grave o la muerte. La seguridad de las vacunas se controla permanentemente. Para obtener ms informacin, visite: www.cdc.gov/vaccinesafety/ 5. Qu pasa si hay una reaccin grave? A qu signos debo estar atento?  Est atento a la aparicin de signos preocupantes, como los de una reaccin alrgica grave, fiebre muy alta o comportamiento fuera de lo normal. Los signos de una reaccin alrgica grave pueden incluir ronchas, hinchazn de la cara y la garganta, dificultad para respirar, latidos cardacos acelerados, mareos y debilidad. Generalmente, estos comienzan entre unos pocos minutos y algunas horas despus de la vacunacin. Qu debo hacer?  Si usted piensa que se trata de una reaccin alrgica grave o de otra emergencia que no puede esperar, llame al 9-1-1 o dirjase al hospital ms cercano. De lo contrario, llame al mdico.  Despus, la reaccin debe informarse al Sistema de Informe de Eventos Adversos de Vacunas (Vaccine Adverse Event Reporting System, VAERS). El mdico puede presentar  este informe, o bien puede hacerlo usted mismo a travs del sitio web de VAERS, en www.vaers.hhs.gov, o llamando al 1-800-822-7967. El Sistema de Informe de Eventos Adversos de Vacunas no brinda recomendaciones mdicas. 6. Programa Nacional de Compensacin de Daos por Vacunas El Programa Nacional de Compensacin de Daos por Vacunas (National Vaccine Injury Compensation Program, VICP) es un programa federal que fue creado para compensar a las personas que puedan haber sufrido daos al recibir ciertas vacunas. Aquellas personas que consideren que han sufrido un dao como consecuencia de una vacuna y quieran saber ms acerca del programa y de cmo presentar un reclamo, pueden llamar al 1-800-338-2382 o visitar el sitio web del VICP en www.hrsa.gov/vaccinecompensation. Hay un lmite de tiempo para presentar un reclamo de compensacin. 7. Cmo puedo obtener ms informacin?  Consulte a su mdico. Este puede darle el prospecto de la vacuna o recomendarle otras fuentes de informacin.  Comunquese con el servicio de salud de su localidad o su estado.  Comunquese con los Centers for Disease Control and Prevention, CDC (Centros para el Control y la Prevencin de Enfermedades): ? Llame al 1-800-232-4636 (1-800-CDC-INFO) ? Visite el sitio web de los CDC en www.cdc.gov/vaccines Declaracin de informacin sobre la vacuna Td (07/04/2016) Esta informacin no tiene como fin reemplazar el consejo del mdico. Asegrese de hacerle al mdico cualquier pregunta que tenga. Document Released: 12/03/2008 Document Revised: 04/20/2018 Document Reviewed: 04/20/2018   Chartered certified accountant Patient Education  2020 [place] antineumoccica conjugada (PCV13): Lo que debe saber Pneumococcal Conjugate Vaccine (PCV13): What You Need to Know 1. Por qu vacunarse? La vacuna antineumoccica conjugada (PCV13) puede prevenir la enfermedad neumoccica. Enfermedad neumoccica hace referencia a cualquier enfermedad  causada por bacterias neumoccicas. Estas bacterias pueden causar muchos tipos de enfermedades, entre ellas neumona, que es una infeccin de los pulmones. Las bacterias neumoccicas son Ardelia Mems de las causas ms comunes de la neumona. Adems de neumona, las bacterias neumoccicas tambin pueden causar lo siguiente:  Infecciones en los odos  Infecciones de los senos paranasales  Meningitis (infeccin del tejido que recubre el cerebro y la mdula espinal)  Bacteriemia (infeccin en el torrente sanguneo) Cualquier persona puede contraer la enfermedad neumoccica, pero los nios menores de 2 aos, las personas con [employer], los adultos mayores de 43 aos y los fumadores son los que corren un mayor riesgo. La mayora de las infecciones neumoccicas son leves. Sin embargo, algunas pueden causar problemas a largo plazo, como dao cerebral o prdida de la audicin. La meningitis, la bacteriemia y la neumona causadas por la enfermedad neumoccica pueden ser mortales. 2. PCV13 La vacuna PCV13 protege contra 13 tipos de bacterias que causan la enfermedad neumoccica. Los bebs y los nios pequeos por lo general necesitan 4 dosis de la vacuna antineumoccica conjugada, a los 2, 4, 6 y 64 a 15 meses. En algunos casos, un nio podra necesitar menos de 4 dosis para completar la vacunacin con la PCV13. Tambin se recomienda una dosis de la vacuna PCV23 para cualquier persona de 2 aos de edad o ms con ciertas enfermedades si todava no recibi la vacuna PCV13. Esta vacuna se puede administrar a adultos de 65 aos o ms en funcin de las conversaciones entre el paciente y el mdico. 3. Hable con el mdico Comunquese con la persona que le coloca las vacunas si la persona que la recibe:  Ha tenido una reaccin alrgica despus de Ardelia Mems dosis previa de la vacuna PCV13, a una vacuna neumoccica conjugada anterior conocida como PCV7 o a cualquier vacuna que contenga toxoide diftrico (por ejemplo,  difteria, ttanos y tos [place] [DTap]), o tiene [place] grave y potencialmente mortal.  En algunos casos, es posible que el mdico decida posponer la aplicacin de la vacuna PCV13 para una visita en el futuro. Las personas que sufren trastornos menores, como un resfro, pueden vacunarse. Las personas que tienen enfermedades moderadas o graves generalmente deben esperar hasta recuperarse para poder vacunarse con la PCV13. Su mdico puede darle ms informacin. 4. [place]gos de [place] reaccin a la vacuna  Puede sentir enrojecimiento, hinchazn, dolor o sensibilidad en TEFL teacher de la inyeccin, y Social worker, prdida del apetito, nerviosismo (irritabilidad), cansancio, dolor de [place] y escalofros despus de recibir la vacuna PCV13. Los nios pequeos pueden correr un mayor riesgo de sufrir convulsiones causadas por la fiebre despus de la administracin de la vacuna PCV13 si esta se administra al mismo tiempo que una vacuna contra la gripe inactivada. Consulte a su mdico para obtener ms informacin. Las personas a veces se desmayan despus de procedimientos mdicos, incluida la vacunacin. Informe al mdico si se siente mareado, tiene cambios en la visin o zumbidos en los odos. Al igual que con cualquier [employer], existe una probabilidad muy remota de que una vacuna cause una reaccin alrgica grave, otra lesin grave o la muerte. 5. Qu pasa si se presenta un problema grave? Podra producirse una reaccin alrgica despus  de que la persona vacunada abandone la clnica. Si observa signos de Nurse, mental health grave (ronchas, hinchazn de la cara y la garganta, dificultad para respirar, latidos cardacos acelerados, mareos o debilidad), llame al 9-1-1 y lleve a la persona al hospital ms cercano. Si se presentan otros signos que le preocupan, comunquese con su mdico. Las reacciones adversas deben informarse al Sistema de Informe de Eventos Adversos de Clinical biochemist (Vaccine Adverse Event  Reporting System, VAERS). Por lo general, el mdico presenta este informe o puede hacerlo usted mismo. Visite el sitio web del VAERS en www.vaers.SamedayNews.es o llame al 252-332-2784.El VAERS es solo para Electrical engineer; su personal no proporciona asesoramiento mdico. 6. Programa Nacional de Compensacin de Daos por [place] de Compensacin de Daos por Clinical biochemist (National Vaccine Injury Fiserv, Runner, broadcasting/film/video) es un programa federal que fue creado para Patent examiner a las personas que puedan haber sufrido daos al recibir ciertas vacunas. Visite el sitio web del VICP en GoldCloset.com.ee o llame al 1-386-299-4509 para obtener ms informacin acerca del programa y de cmo presentar un reclamo. Hay un lmite de tiempo para presentar un reclamo de compensacin. 7. Cmo puedo obtener ms informacin?  Pregntele a su mdico.  Comunquese con el servicio de salud de su localidad o su estado.  Comunquese con los Centros para el Control y la Prevencin de Probation officer for Disease Control and Prevention, CDC): ? Llame al 3653480054 (1-800-CDC-INFO) o ? Visite el sitio Biomedical engineer en http://hunter.com/ Declaracin de informacin de vacunas sobre la vacuna PCV13 (06/29/2018) Esta informacin no tiene Marine scientist el consejo del mdico. Asegrese de hacerle al mdico cualquier pregunta que tenga. Document Released: 02/03/2008 Document Revised: 07/08/2018 Document Reviewed: 04/20/2018 Elsevier Patient Education  2020 [employer].

## 2019-04-06 NOTE — Progress Notes (Signed)
Patient ID: Joel Adkins, male    DOB: [birth year]  MRN: [id number]  CC: New Patient (Initial Visit)   Subjective: Joel Adkins is a 78 y.o. male who presents for new pt visit.  Peppe Delatore, from [employer] is with him and interprets.  Pt oringinally from [place].   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  Last seen via video 01/03/2019.  She has relocated to [place].   Pt with hx of HL and BPH. On Lipitor and Flomax Tolerating Lipitor okay.  Denies muscle aches/cramps  BPH:  Passing urine okay.  Wakes 2 times at nights to urinate, flows freely.  No blood in urine  HM:  Had c-scope 3-4 yrs ago and was okay.   Past medical history, social history, family history and surgical history reviewed and updated. Patient Active Problem List   Diagnosis Date Noted  . 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 atorvastatin (LIPITOR) 10 MG tablet Take 10 mg by mouth daily.    . tamsulosin (FLOMAX) 0.4 MG CAPS capsule Take 0.4 mg by mouth.    . Glycerin-Hypromellose-PEG 400 ([facility] DRY EYE RELIEF) 0.2-0.2-1 % SOLN Place 1-2 drops into both eyes 3 (three) times daily as needed (for dry eyes.).     No current facility-administered medications on file prior to visit.     No Known Allergies  Social History   Socioeconomic History  . Marital status: Married    Spouse name: Not on file  . Number of children: 2  . Years of education: Not on file  . Highest education level: Not on file  Occupational History  . Not on file  Social Needs  . Financial resource strain: Not on file  . Food insecurity    Worry: Not on file    Inability: Not on file  . Transportation needs    Medical: Not on file    Non-medical: Not on file  Tobacco Use  . Smoking status: Former Smoker    Quit date: 06/15/1964    Years since quitting: 54.8  . Smokeless  tobacco: Never Used  Substance and Sexual Activity  . Alcohol use: No  . Drug use: No  . Sexual activity: Not on file  Lifestyle  . Physical activity    Days per week: Not on file    Minutes per session: Not on file  . Stress: Not on file  Relationships  . Social Herbalist on phone: Not on file    Gets together: Not on file    Attends religious service: Not on file    Active member of club or organization: Not on file    Attends meetings of clubs or organizations: Not on file    Relationship status: Not on file  . Intimate partner violence    Fear of current or ex partner: Not on file    Emotionally abused: Not on file    Physically abused: Not on file    Forced sexual activity: Not on file  Other Topics Concern  . Not on file  Social History Narrative  . Not on file    History reviewed. No pertinent family history.  Past Surgical History:  Procedure Laterality Date  . FINGER SURGERY Left 1997   middle finger  . HERNIA REPAIR Bilateral 2016  . 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       Sleeping well.  Walks daily for 1/2 hr  Eyes: Negative for visual disturbance.       Wears reading glasses.  Last eye exam was 3-4 yrs ago  Respiratory: Negative for cough and shortness of breath.   Cardiovascular: Negative for chest pain, palpitations and leg swelling.  Gastrointestinal: Negative for abdominal pain and blood in stool.  Genitourinary: Negative for difficulty urinating and hematuria.  Skin:       Black spot on neck for a while but started inc in size several mths ago.  Neurological: Negative for dizziness and headaches.     PHYSICAL EXAM: BP 125/84   Pulse 86   Temp 98 F (36.7 C) (Oral)   Resp 16   Ht 5\' 4"  (1.626 m)    Wt 159 lb 9.6 oz (72.4 kg)   SpO2 95%   BMI 27.40 kg/m   Wt Readings from Last 3 Encounters:  04/06/19 159 lb 9.6 oz (72.4 kg)  07/13/17 150 lb (68 kg)  06/15/17 159 lb (72.1 kg)    Physical Exam  General appearance - alert, well appearing, and in no distress Mental status - normal mood, behavior, speech, dress, motor activity, and thought processes Eyes - pupils equal and reactive, extraocular eye movements intact Nose - normal and patent, no erythema, discharge or polyps Mouth - mucous membranes moist, pharynx normal without lesions Neck - supple, no significant adenopathy Lymphatics - no palpable lymphadenopathy, no hepatosplenomegaly Chest - clear to auscultation, no wheezes, rales or rhonchi, symmetric air entry Heart - normal rate, regular rhythm, normal S1, S2, no murmurs, rubs, clicks or gallops Abdomen - soft, nontender, nondistended, no masses or organomegaly Extremities - peripheral pulses normal, no pedal edema, no clubbing or cyanosis Skin - normal coloration and turgor, hyperpigmented 1 cm stalk lesion on the anterior upper chest just below the sternal notch    Depression screen [facility] 2/9 04/06/2019  Decreased Interest 0  Down, Depressed, Hopeless 0  PHQ - 2 Score 0   GAD 7 : Generalized Anxiety Score 04/06/2019  Nervous, Anxious, on Edge 0  Control/stop worrying 0  Worry too much - different things 0  Trouble relaxing 0  Restless 0  Easily annoyed or irritable 0  Afraid - awful might happen 0  Total GAD 7 Score 0       CMP Latest Ref Rng & Units 07/15/2017 07/14/2017 06/15/2017  Glucose 65 - 99 mg/dL 134(H) 122(H) 100(H)  BUN 6 - 20 mg/dL 21(H) 19 17  Creatinine 0.61 - 1.24 mg/dL 1.24 1.01 0.87  Sodium 135 - 145 mmol/L 130(L) 131(L) 138  Potassium 3.5 - 5.1 mmol/L 4.5 4.4 4.0  Chloride 101 - 111 mmol/L 96(L) 100(L) 102  CO2 22 - 32 mmol/L 26 23 28   Calcium 8.9 - 10.3 mg/dL 8.1(L) 7.7(L) 9.1  Total Protein - - - -  Total Bilirubin - - - -  Alkaline  Phos - - - -  AST - - - -  ALT - - - -   Lipid Panel  No results found for: CHOL, TRIG, HDL, CHOLHDL, VLDL, LDLCALC, LDLDIRECT  CBC    Component Value Date/Time   WBC 7.9 07/15/2017 0352   RBC 4.58 07/15/2017 0352  HGB 12.7 (L) 07/15/2017 0352   HCT 38.1 (L) 07/15/2017 0352   PLT 221 07/15/2017 0352   MCV 83.2 07/15/2017 0352   MCH 27.6 07/15/2017 0352   MCHC 33.2 07/15/2017 0352   RDW 14.3 07/15/2017 0352   LYMPHSABS 1.4 07/27/2008 1245   MONOABS 0.4 07/27/2008 1245   EOSABS 0.0 07/27/2008 1245   BASOSABS 0.0 07/27/2008 1245    ASSESSMENT AND PLAN: 1. Hyperlipidemia, unspecified hyperlipidemia type Patient on moderate intensity statin in the form of Lipitor.  Patient over the age of 49 and apparently has been on it for a while. - CBC - Comprehensive metabolic panel - Lipid panel  2. Benign prostatic hyperplasia without lower urinary tract symptoms Symptoms well controlled on Flomax.  3. Skin tag This appears to be a skin tag.  I recommend referral to dermatology to have this removed.  Patient states that he will try some over-the-counter cream to see if it will fall off.  I told him that any over-the-counter cream will probably not work.  He again declined referral to dermatology stating that if his method does not work then he will call back for dermatology referral  4. Hyperopia of both eyes - Ambulatory referral to Ophthalmology  5. Elevated blood pressure reading DASH diet discussed and encouraged.  Encouraged him to continue regular exercise.  6.  Need for Tdap Given  7.  Need for Prevnar 13 vaccine   Patient was given the opportunity to ask questions.  Patient verbalized understanding of the plan and was able to repeat key elements of the plan.   Orders Placed This Encounter  Procedures  . Pneumococcal conjugate vaccine 13-valent IM  . Tdap vaccine greater than or equal to 7yo IM  . CBC  . Comprehensive metabolic panel  . Lipid panel  . Ambulatory  referral to Ophthalmology     Requested Prescriptions    No prescriptions requested or ordered in this encounter    No follow-ups on file.  Karle Plumber, MD, FACP

## 2019-04-07 LAB — CBC
Hematocrit: 53.1 % — ABNORMAL HIGH (ref 37.5–51.0)
Hemoglobin: 16.8 g/dL (ref 13.0–17.7)
MCH: 26.8 pg (ref 26.6–33.0)
MCHC: 31.6 g/dL (ref 31.5–35.7)
MCV: 85 fL (ref 79–97)
Platelets: 201 10*3/uL (ref 150–450)
RBC: 6.27 x10E6/uL — ABNORMAL HIGH (ref 4.14–5.80)
RDW: 17.5 % — ABNORMAL HIGH (ref 11.6–15.4)
WBC: 5.1 10*3/uL (ref 3.4–10.8)

## 2019-04-07 LAB — COMPREHENSIVE METABOLIC PANEL
ALT: 15 IU/L (ref 0–44)
AST: 20 IU/L (ref 0–40)
Albumin/Globulin Ratio: 1.6 (ref 1.2–2.2)
Albumin: 4.5 g/dL (ref 3.7–4.7)
Alkaline Phosphatase: 126 IU/L — ABNORMAL HIGH (ref 39–117)
BUN/Creatinine Ratio: 9 — ABNORMAL LOW (ref 10–24)
BUN: 9 mg/dL (ref 8–27)
Bilirubin Total: 0.7 mg/dL (ref 0.0–1.2)
CO2: 22 mmol/L (ref 20–29)
Calcium: 9.4 mg/dL (ref 8.6–10.2)
Chloride: 94 mmol/L — ABNORMAL LOW (ref 96–106)
Creatinine, Ser: 1 mg/dL (ref 0.76–1.27)
GFR calc Af Amer: 82 mL/min/{1.73_m2} (ref >59)
GFR calc non Af Amer: 71 mL/min/{1.73_m2} (ref >59)
Globulin, Total: 2.8 g/dL (ref 1.5–4.5)
Glucose: 93 mg/dL (ref 65–99)
Potassium: 4.7 mmol/L (ref 3.5–5.2)
Sodium: 131 mmol/L — ABNORMAL LOW (ref 134–144)
Total Protein: 7.3 g/dL (ref 6.0–8.5)

## 2019-04-07 LAB — LIPID PANEL
Chol/HDL Ratio: 2.4 ratio (ref 0.0–5.0)
Cholesterol, Total: 134 mg/dL (ref 100–199)
HDL: 57 mg/dL (ref >39)
LDL Calculated: 57 mg/dL (ref 0–99)
Triglycerides: 100 mg/dL (ref 0–149)
VLDL Cholesterol Cal: 20 mg/dL (ref 5–40)

## 2019-04-12 ENCOUNTER — Telehealth: Payer: Self-pay

## 2019-04-12 ENCOUNTER — Other Ambulatory Visit: Payer: Self-pay | Admitting: Internal Medicine

## 2019-04-12 DIAGNOSIS — D751 Secondary polycythemia: Secondary | ICD-10-CM

## 2019-04-12 NOTE — Telephone Encounter (Signed)
[employer] interpre[employer] Joel Adkins  Id# [phone]  contacted pt to go over lab results contacted pt to go over lab results pt is aware and doesn't have any questions or concerns

## 2019-05-24 ENCOUNTER — Other Ambulatory Visit: Payer: Self-pay

## 2019-05-24 ENCOUNTER — Ambulatory Visit: Payer: Medicare HMO | Attending: Internal Medicine

## 2019-05-24 DIAGNOSIS — D751 Secondary polycythemia: Secondary | ICD-10-CM

## 2019-05-25 LAB — CBC
Hematocrit: 49.6 % (ref 37.5–51.0)
Hemoglobin: 16 g/dL (ref 13.0–17.7)
MCH: 27.9 pg (ref 26.6–33.0)
MCHC: 32.3 g/dL (ref 31.5–35.7)
MCV: 86 fL (ref 79–97)
Platelets: 219 10*3/uL (ref 150–450)
RBC: 5.74 x10E6/uL (ref 4.14–5.80)
RDW: 16.5 % — ABNORMAL HIGH (ref 11.6–15.4)
WBC: 4.7 10*3/uL (ref 3.4–10.8)

## 2019-05-31 ENCOUNTER — Ambulatory Visit: Payer: Medicare HMO | Attending: Family Medicine | Admitting: Pharmacist

## 2019-05-31 ENCOUNTER — Other Ambulatory Visit: Payer: Self-pay

## 2019-05-31 DIAGNOSIS — Z23 Encounter for immunization: Secondary | ICD-10-CM

## 2019-05-31 NOTE — Progress Notes (Signed)
Patient presents for vaccination against influenza per orders of Dr. Johnson. Consent given. Counseling provided. No contraindications exists. Vaccine administered without incident.   

## 2019-07-07 ENCOUNTER — Encounter: Payer: Self-pay | Admitting: Internal Medicine

## 2019-07-07 ENCOUNTER — Ambulatory Visit: Payer: Medicare HMO | Attending: Internal Medicine | Admitting: Internal Medicine

## 2019-07-07 ENCOUNTER — Other Ambulatory Visit: Payer: Self-pay

## 2019-07-07 DIAGNOSIS — E871 Hypo-osmolality and hyponatremia: Secondary | ICD-10-CM

## 2019-07-07 DIAGNOSIS — N400 Enlarged prostate without lower urinary tract symptoms: Secondary | ICD-10-CM

## 2019-07-07 DIAGNOSIS — E785 Hyperlipidemia, unspecified: Secondary | ICD-10-CM

## 2019-07-07 MED ORDER — ATORVASTATIN CALCIUM 10 MG PO TABS: 10.0000 mg | ORAL_TABLET | Freq: Every day | ORAL | 5 refills | Status: DC

## 2019-07-07 NOTE — Progress Notes (Signed)
Virtual Visit via Telephone Note Due to current restrictions/limitations of in-office visits due to the COVID-19 pandemic, this scheduled clinical appointment was converted to a telehealth visit  I connected with Joel Adkins on 07/07/19 at 1:40 p.m by telephone and verified that I am speaking with the correct person using two identifiers. I am in my office.  The patient is at home.  Only the patient, myself and Molli Barrows from [employer] [id number] participated in this encounter.  I discussed the limitations, risks, security and privacy concerns of performing an evaluation and management service by telephone and the availability of in person appointments. I also discussed with the patient that there may be a patient responsible charge related to this service. The patient expressed understanding and agreed to proceed.   History of Present Illness: Pt with hx of HL and BPH.  This is for routine f/u visit.   I went over lab results with him from last visit.  Labs were okay except for mild chronic hyponatremia.  Reports compliance with Lipitor  BPH:  Passing urine good.  No hematuria  Reports good appetite.  Walks 15 mins-1 hr daily for exercise.  Sleeping well. No CP/SOB/LE edema  Outpatient Encounter Medications as of 07/07/2019  Medication Sig  . atorvastatin (LIPITOR) 10 MG tablet Take 10 mg by mouth daily.  . Glycerin-Hypromellose-PEG 400 ([facility] DRY EYE RELIEF) 0.2-0.2-1 % SOLN Place 1-2 drops into both eyes 3 (three) times daily as needed (for dry eyes.).  Marland Kitchen tamsulosin (FLOMAX) 0.4 MG CAPS capsule Take 0.4 mg by mouth.   No facility-administered encounter medications on file as of 07/07/2019.     Observations/Objective: Results for orders placed or performed in visit on 05/24/19  CBC  Result Value Ref Range   WBC 4.7 3.4 - 10.8 x10E3/uL   RBC 5.74 4.14 - 5.80 x10E6/uL   Hemoglobin 16.0 13.0 - 17.7 g/dL   Hematocrit 49.6 37.5 - 51.0 %   MCV 86 79 - 97 fL   MCH 27.9  26.6 - 33.0 pg   MCHC 32.3 31.5 - 35.7 g/dL   RDW 16.5 (H) 11.6 - 15.4 %   Platelets 219 150 - 450 x10E3/uL     Assessment and Plan: 1. Hyperlipidemia, unspecified hyperlipidemia type Encourage patient to continue healthy eating habits and regular exercise.  He is doing well considering his age. - atorvastatin (LIPITOR) 10 MG tablet; Take 1 tablet (10 mg total) by mouth daily.  Dispense: 30 tablet; Refill: 5  2. Benign prostatic hyperplasia without lower urinary tract symptoms Doing well on Flomax  3. Hyponatremia Stable and asymptomatic.  Will observe for now.   Follow Up Instructions: 4-5 mth   I discussed the assessment and treatment plan with the patient. The patient was provided an opportunity to ask questions and all were answered. The patient agreed with the plan and demonstrated an understanding of the instructions.   The patient was advised to call back or seek an in-person evaluation if the symptoms worsen or if the condition fails to improve as anticipated.  I provided 16 minutes of non-face-to-face time during this encounter.   Karle Plumber, MD

## 2019-10-02 ENCOUNTER — Other Ambulatory Visit: Payer: Self-pay

## 2019-10-02 ENCOUNTER — Ambulatory Visit: Payer: Medicare HMO | Attending: Internal Medicine | Admitting: Internal Medicine

## 2019-10-02 DIAGNOSIS — Z5329 Procedure and treatment not carried out because of patient's decision for other reasons: Secondary | ICD-10-CM

## 2019-10-04 NOTE — Progress Notes (Signed)
No show

## 2019-10-10 ENCOUNTER — Other Ambulatory Visit: Payer: Self-pay

## 2019-10-10 ENCOUNTER — Ambulatory Visit: Payer: Medicare HMO | Attending: Internal Medicine | Admitting: Internal Medicine

## 2019-10-10 DIAGNOSIS — Z23 Encounter for immunization: Secondary | ICD-10-CM

## 2019-10-10 DIAGNOSIS — E785 Hyperlipidemia, unspecified: Secondary | ICD-10-CM

## 2019-10-10 DIAGNOSIS — J189 Pneumonia, unspecified organism: Secondary | ICD-10-CM | POA: Diagnosis not present

## 2019-10-10 MED ORDER — BENZONATATE 100 MG PO CAPS: 100.0000 mg | ORAL_CAPSULE | Freq: Three times a day (TID) | ORAL | 0 refills | Status: DC | PRN

## 2019-10-10 MED ORDER — COVID-19 MRNA VACCINE (PFIZER) 30 MCG/0.3ML IM SUSP: 0.3000 mL | Freq: Once | INTRAMUSCULAR | 0 refills | Status: AC

## 2019-10-10 MED ORDER — ATORVASTATIN CALCIUM 10 MG PO TABS: 10.0000 mg | ORAL_TABLET | Freq: Every day | ORAL | 5 refills | Status: DC

## 2019-10-10 NOTE — Progress Notes (Signed)
Virtual Visit via Telephone Note Due to current restrictions/limitations of in-office visits due to the COVID-19 pandemic, this scheduled clinical appointment was converted to a telehealth visit  I connected with Joel Adkins on 10/10/19 at 10:51 p.m by telephone and verified that I am speaking with the correct person using two identifiers. I am in my office.  The patient is at home.  Only the patient,myself and Marylou Flesher from [employer] [id number]) participated in this encounter.  I discussed the limitations, risks, security and privacy concerns of performing an evaluation and management service by telephone and the availability of in person appointments. I also discussed with the patient that there may be a patient responsible charge related to this service. The patient expressed understanding and agreed to proceed.   History of Present Illness: Pt with hx of chronic hyponatremia, HL and BPH. Last eval 07/2019.  Purpose of today's visit is chronic ds management.  Pt seen in ER in [facility] 09/07/2019 for feeling of chill, cough and congestion.  I was able to review the record on [facility] Everywhere.  No fever noted. Screenings for COVID and flu were negative.  CXR revealed mild BL lung densities which may represent atelectasis vs bibasilar pneumonia.  CBC without elev WBC, still with mild hypoNa+ -completed Z-pack -since then, he denies any fever, SOB, sore throat or congestion.  Cough has decreased.  Still has a little cough productive of white phlegm.  "Not that much but I still have it."  He gets out and walk daily for exercise.   -requesting Covid vaccine.  States that he saw on the news that his pharmacy has the vaccine but they will need a prescription from the PCP.  HL:  Request RF on Lipitor.  Out times 1 mth  BPH:  Doing well on Flomax   Observations/Objective:  Results for orders placed or performed in visit on 05/24/19  CBC  Result Value Ref Range   WBC 4.7 3.4 - 10.8  x10E3/uL   RBC 5.74 4.14 - 5.80 x10E6/uL   Hemoglobin 16.0 13.0 - 17.7 g/dL   Hematocrit 49.6 37.5 - 51.0 %   MCV 86 79 - 97 fL   MCH 27.9 26.6 - 33.0 pg   MCHC 32.3 31.5 - 35.7 g/dL   RDW 16.5 (H) 11.6 - 15.4 %   Platelets 219 150 - 450 x10E3/uL     Chemistry      Component Value Date/Time   NA 131 (L) 04/06/2019 1127   K 4.7 04/06/2019 1127   CL 94 (L) 04/06/2019 1127   CO2 22 04/06/2019 1127   BUN 9 04/06/2019 1127   CREATININE 1.00 04/06/2019 1127      Component Value Date/Time   CALCIUM 9.4 04/06/2019 1127   ALKPHOS 126 (H) 04/06/2019 1127   AST 20 04/06/2019 1127   ALT 15 04/06/2019 1127   BILITOT 0.7 04/06/2019 1127     Lab Results  Component Value Date   CHOL 134 04/06/2019   HDL 57 04/06/2019   LDLCALC 57 04/06/2019   TRIG 100 04/06/2019   CHOLHDL 2.4 04/06/2019    Assessment and Plan: 1. Community acquired pneumonia, unspecified laterality Patient by history has significantly improved.  Advised that cough can sometimes linger for several weeks.  If it does not resolve within the next 1 to 2 weeks I have asked that he schedule an in person appointment. - benzonatate (TESSALON PERLES) 100 MG capsule; Take 1 capsule (100 mg total) by mouth 3 (three) times daily  as needed for cough.  Dispense: 20 capsule; Refill: 0  2. Hyperlipidemia, unspecified hyperlipidemia type - atorvastatin (LIPITOR) 10 MG tablet; Take 1 tablet (10 mg total) by mouth daily.  Dispense: 30 tablet; Refill: 5  3. High priority for COVID-19 virus vaccination Patient should get the vaccine I have entered the prescription so that he can get it from his pharmacy if it is being offered there.  If it is not being offered there, advised patient to call the health department to get it through them. - COVID-19 mRNA vaccine, Pfizer, 30 MCG/0.3ML SUSP; Inject 0.3 mLs into the muscle once for 1 dose.  Dispense: 6.3 mL; Refill: 0   Follow Up Instructions: 3 mths or sooner if cough does not resolve  completely in the next 1-2 wks   I discussed the assessment and treatment plan with the patient. The patient was provided an opportunity to ask questions and all were answered. The patient agreed with the plan and demonstrated an understanding of the instructions.   The patient was advised to call back or seek an in-person evaluation if the symptoms worsen or if the condition fails to improve as anticipated.  I provided 21 minutes of non-face-to-face time during this encounter.   Karle Plumber, MD

## 2020-01-09 ENCOUNTER — Ambulatory Visit: Payer: Medicare HMO | Admitting: Internal Medicine

## 2020-10-15 DIAGNOSIS — H52223 Regular astigmatism, bilateral: Secondary | ICD-10-CM | POA: Diagnosis not present

## 2020-10-15 DIAGNOSIS — E785 Hyperlipidemia, unspecified: Secondary | ICD-10-CM | POA: Diagnosis not present

## 2020-10-15 DIAGNOSIS — Z961 Presence of intraocular lens: Secondary | ICD-10-CM | POA: Diagnosis not present

## 2020-10-15 DIAGNOSIS — H6122 Impacted cerumen, left ear: Secondary | ICD-10-CM | POA: Diagnosis not present

## 2020-10-15 DIAGNOSIS — H524 Presbyopia: Secondary | ICD-10-CM | POA: Diagnosis not present

## 2020-10-15 DIAGNOSIS — H353130 Nonexudative age-related macular degeneration, bilateral, stage unspecified: Secondary | ICD-10-CM | POA: Diagnosis not present

## 2020-10-15 DIAGNOSIS — N400 Enlarged prostate without lower urinary tract symptoms: Secondary | ICD-10-CM | POA: Diagnosis not present

## 2020-10-15 DIAGNOSIS — H5202 Hypermetropia, left eye: Secondary | ICD-10-CM | POA: Diagnosis not present

## 2020-10-15 DIAGNOSIS — H02119 Cicatricial ectropion of unspecified eye, unspecified eyelid: Secondary | ICD-10-CM | POA: Diagnosis not present

## 2020-10-15 DIAGNOSIS — H401133 Primary open-angle glaucoma, bilateral, severe stage: Secondary | ICD-10-CM | POA: Diagnosis not present

## 2020-10-15 DIAGNOSIS — Z23 Encounter for immunization: Secondary | ICD-10-CM | POA: Diagnosis not present

## 2020-10-30 DIAGNOSIS — M87852 Other osteonecrosis, left femur: Secondary | ICD-10-CM | POA: Diagnosis not present

## 2020-10-30 DIAGNOSIS — M25552 Pain in left hip: Secondary | ICD-10-CM | POA: Diagnosis not present

## 2020-11-22 DIAGNOSIS — N400 Enlarged prostate without lower urinary tract symptoms: Secondary | ICD-10-CM | POA: Diagnosis not present

## 2020-11-22 DIAGNOSIS — R030 Elevated blood-pressure reading, without diagnosis of hypertension: Secondary | ICD-10-CM | POA: Diagnosis not present

## 2020-11-22 DIAGNOSIS — E039 Hypothyroidism, unspecified: Secondary | ICD-10-CM | POA: Diagnosis not present

## 2020-11-22 DIAGNOSIS — Z7951 Long term (current) use of inhaled steroids: Secondary | ICD-10-CM | POA: Diagnosis not present

## 2020-11-22 DIAGNOSIS — Z87891 Personal history of nicotine dependence: Secondary | ICD-10-CM | POA: Diagnosis not present

## 2020-11-22 DIAGNOSIS — E785 Hyperlipidemia, unspecified: Secondary | ICD-10-CM | POA: Diagnosis not present

## 2020-11-22 DIAGNOSIS — J40 Bronchitis, not specified as acute or chronic: Secondary | ICD-10-CM | POA: Diagnosis not present

## 2021-02-13 DIAGNOSIS — E785 Hyperlipidemia, unspecified: Secondary | ICD-10-CM | POA: Diagnosis not present

## 2021-02-13 DIAGNOSIS — R972 Elevated prostate specific antigen [PSA]: Secondary | ICD-10-CM | POA: Diagnosis not present

## 2021-02-13 DIAGNOSIS — N400 Enlarged prostate without lower urinary tract symptoms: Secondary | ICD-10-CM | POA: Diagnosis not present

## 2021-02-13 DIAGNOSIS — E559 Vitamin D deficiency, unspecified: Secondary | ICD-10-CM | POA: Diagnosis not present

## 2021-03-18 DIAGNOSIS — Z0001 Encounter for general adult medical examination with abnormal findings: Secondary | ICD-10-CM | POA: Diagnosis not present

## 2021-03-18 DIAGNOSIS — R972 Elevated prostate specific antigen [PSA]: Secondary | ICD-10-CM | POA: Diagnosis not present

## 2021-03-18 DIAGNOSIS — E039 Hypothyroidism, unspecified: Secondary | ICD-10-CM | POA: Diagnosis not present

## 2021-03-18 DIAGNOSIS — E559 Vitamin D deficiency, unspecified: Secondary | ICD-10-CM | POA: Diagnosis not present

## 2021-03-18 DIAGNOSIS — N400 Enlarged prostate without lower urinary tract symptoms: Secondary | ICD-10-CM | POA: Diagnosis not present

## 2021-03-18 DIAGNOSIS — E785 Hyperlipidemia, unspecified: Secondary | ICD-10-CM | POA: Diagnosis not present

## 2021-03-18 DIAGNOSIS — R7989 Other specified abnormal findings of blood chemistry: Secondary | ICD-10-CM | POA: Diagnosis not present

## 2021-03-18 DIAGNOSIS — Z125 Encounter for screening for malignant neoplasm of prostate: Secondary | ICD-10-CM | POA: Diagnosis not present

## 2021-03-18 DIAGNOSIS — N320 Bladder-neck obstruction: Secondary | ICD-10-CM | POA: Diagnosis not present

## 2021-04-09 DIAGNOSIS — R972 Elevated prostate specific antigen [PSA]: Secondary | ICD-10-CM | POA: Diagnosis not present

## 2021-04-09 DIAGNOSIS — E039 Hypothyroidism, unspecified: Secondary | ICD-10-CM | POA: Diagnosis not present

## 2021-04-09 DIAGNOSIS — U071 COVID-19: Secondary | ICD-10-CM | POA: Diagnosis not present

## 2021-04-09 DIAGNOSIS — J4540 Moderate persistent asthma, uncomplicated: Secondary | ICD-10-CM | POA: Diagnosis not present

## 2021-04-09 DIAGNOSIS — Z20822 Contact with and (suspected) exposure to covid-19: Secondary | ICD-10-CM | POA: Diagnosis not present

## 2021-09-19 DIAGNOSIS — E039 Hypothyroidism, unspecified: Secondary | ICD-10-CM | POA: Diagnosis not present

## 2021-09-19 DIAGNOSIS — R972 Elevated prostate specific antigen [PSA]: Secondary | ICD-10-CM | POA: Diagnosis not present

## 2021-09-19 DIAGNOSIS — M545 Low back pain, unspecified: Secondary | ICD-10-CM | POA: Diagnosis not present

## 2021-09-19 DIAGNOSIS — Z23 Encounter for immunization: Secondary | ICD-10-CM | POA: Diagnosis not present

## 2021-09-19 DIAGNOSIS — N400 Enlarged prostate without lower urinary tract symptoms: Secondary | ICD-10-CM | POA: Diagnosis not present

## 2021-09-19 DIAGNOSIS — R7301 Impaired fasting glucose: Secondary | ICD-10-CM | POA: Diagnosis not present

## 2021-09-19 DIAGNOSIS — E559 Vitamin D deficiency, unspecified: Secondary | ICD-10-CM | POA: Diagnosis not present

## 2021-09-19 DIAGNOSIS — Z0001 Encounter for general adult medical examination with abnormal findings: Secondary | ICD-10-CM | POA: Diagnosis not present

## 2021-09-19 DIAGNOSIS — Z0000 Encounter for general adult medical examination without abnormal findings: Secondary | ICD-10-CM | POA: Diagnosis not present

## 2021-09-19 DIAGNOSIS — N320 Bladder-neck obstruction: Secondary | ICD-10-CM | POA: Diagnosis not present

## 2021-09-19 DIAGNOSIS — E785 Hyperlipidemia, unspecified: Secondary | ICD-10-CM | POA: Diagnosis not present

## 2021-11-18 DIAGNOSIS — R7301 Impaired fasting glucose: Secondary | ICD-10-CM | POA: Diagnosis not present

## 2021-11-18 DIAGNOSIS — R062 Wheezing: Secondary | ICD-10-CM | POA: Diagnosis not present

## 2021-11-18 DIAGNOSIS — J209 Acute bronchitis, unspecified: Secondary | ICD-10-CM | POA: Diagnosis not present

## 2021-11-18 DIAGNOSIS — R051 Acute cough: Secondary | ICD-10-CM | POA: Diagnosis not present

## 2021-11-18 DIAGNOSIS — Z20822 Contact with and (suspected) exposure to covid-19: Secondary | ICD-10-CM | POA: Diagnosis not present

## 2021-11-21 DIAGNOSIS — E669 Obesity, unspecified: Secondary | ICD-10-CM | POA: Diagnosis not present

## 2021-11-21 DIAGNOSIS — Z008 Encounter for other general examination: Secondary | ICD-10-CM | POA: Diagnosis not present

## 2021-11-21 DIAGNOSIS — N400 Enlarged prostate without lower urinary tract symptoms: Secondary | ICD-10-CM | POA: Diagnosis not present

## 2021-11-21 DIAGNOSIS — E785 Hyperlipidemia, unspecified: Secondary | ICD-10-CM | POA: Diagnosis not present

## 2021-11-21 DIAGNOSIS — Z8249 Family history of ischemic heart disease and other diseases of the circulatory system: Secondary | ICD-10-CM | POA: Diagnosis not present

## 2021-11-21 DIAGNOSIS — Z6830 Body mass index (BMI) 30.0-30.9, adult: Secondary | ICD-10-CM | POA: Diagnosis not present

## 2021-11-21 DIAGNOSIS — E039 Hypothyroidism, unspecified: Secondary | ICD-10-CM | POA: Diagnosis not present

## 2021-11-21 DIAGNOSIS — R030 Elevated blood-pressure reading, without diagnosis of hypertension: Secondary | ICD-10-CM | POA: Diagnosis not present

## 2022-01-22 ENCOUNTER — Ambulatory Visit: Payer: Medicare HMO | Admitting: Emergency Medicine

## 2022-01-29 ENCOUNTER — Encounter: Payer: Self-pay | Admitting: Emergency Medicine

## 2022-01-29 ENCOUNTER — Ambulatory Visit (INDEPENDENT_AMBULATORY_CARE_PROVIDER_SITE_OTHER): Payer: Medicare HMO | Admitting: Emergency Medicine

## 2022-01-29 VITALS — BP 120/62 | HR 82 | Temp 97.9°F | Ht 64.0 in | Wt 164.5 lb

## 2022-01-29 DIAGNOSIS — E785 Hyperlipidemia, unspecified: Secondary | ICD-10-CM | POA: Diagnosis not present

## 2022-01-29 DIAGNOSIS — Z7689 Persons encountering health services in other specified circumstances: Secondary | ICD-10-CM

## 2022-01-29 DIAGNOSIS — E039 Hypothyroidism, unspecified: Secondary | ICD-10-CM

## 2022-01-29 DIAGNOSIS — N400 Enlarged prostate without lower urinary tract symptoms: Secondary | ICD-10-CM

## 2022-01-29 LAB — CBC WITH DIFFERENTIAL/PLATELET
Basophils Absolute: 0 10*3/uL (ref 0.0–0.1)
Basophils Relative: 0.7 % (ref 0.0–3.0)
Eosinophils Absolute: 0.1 10*3/uL (ref 0.0–0.7)
Eosinophils Relative: 1.1 % (ref 0.0–5.0)
HCT: 48.8 % (ref 39.0–52.0)
Hemoglobin: 16 g/dL (ref 13.0–17.0)
Lymphocytes Relative: 22.9 % (ref 12.0–46.0)
Lymphs Abs: 1 10*3/uL (ref 0.7–4.0)
MCHC: 32.8 g/dL (ref 30.0–36.0)
MCV: 84 fl (ref 78.0–100.0)
Monocytes Absolute: 0.5 10*3/uL (ref 0.1–1.0)
Monocytes Relative: 10.8 % (ref 3.0–12.0)
Neutro Abs: 2.9 10*3/uL (ref 1.4–7.7)
Neutrophils Relative %: 64.5 % (ref 43.0–77.0)
Platelets: 213 10*3/uL (ref 150.0–400.0)
RBC: 5.81 Mil/uL (ref 4.22–5.81)
RDW: 15.5 % (ref 11.5–15.5)
WBC: 4.5 10*3/uL (ref 4.0–10.5)

## 2022-01-29 LAB — COMPREHENSIVE METABOLIC PANEL
ALT: 12 U/L (ref 0–53)
AST: 21 U/L (ref 0–37)
Albumin: 4.1 g/dL (ref 3.5–5.2)
Alkaline Phosphatase: 112 U/L (ref 39–117)
BUN: 12 mg/dL (ref 6–23)
CO2: 29 mEq/L (ref 19–32)
Calcium: 9.5 mg/dL (ref 8.4–10.5)
Chloride: 99 mEq/L (ref 96–112)
Creatinine, Ser: 0.93 mg/dL (ref 0.40–1.50)
GFR: 76.24 mL/min (ref >60.00)
Glucose, Bld: 99 mg/dL (ref 70–99)
Potassium: 4.6 mEq/L (ref 3.5–5.1)
Sodium: 135 mEq/L (ref 135–145)
Total Bilirubin: 0.7 mg/dL (ref 0.2–1.2)
Total Protein: 7.3 g/dL (ref 6.0–8.3)

## 2022-01-29 LAB — LIPID PANEL
Cholesterol: 142 mg/dL (ref 0–200)
HDL: 53.4 mg/dL (ref >39.00)
LDL Cholesterol: 67 mg/dL (ref 0–99)
NonHDL: 88.31
Total CHOL/HDL Ratio: 3
Triglycerides: 107 mg/dL (ref 0.0–149.0)
VLDL: 21.4 mg/dL (ref 0.0–40.0)

## 2022-01-29 NOTE — Patient Instructions (Signed)
Mantenimiento de la salud despus de los 65 aos de edad Health Maintenance After Age 83 Despus de los 65 aos de edad, corre un riesgo mayor de padecer ciertas enfermedades e infecciones a largo plazo, como tambin de sufrir lesiones por cadas. Las cadas son la causa principal de las fracturas de huesos y lesiones en la cabeza de personas mayores de 65 aos de edad. Recibir cuidados preventivos de forma regular puede ayudarlo a mantenerse saludable y en buen estado. Los cuidados preventivos incluyen realizarse anlisis de forma regular y realizar cambios en el estilo de vida segn las recomendaciones del mdico. Converse con el mdico sobre lo siguiente: Las pruebas de deteccin y los anlisis que debe realizarse. Una prueba de deteccin es un estudio que se para detectar la presencia de una enfermedad cuando no tiene sntomas. Un plan de dieta y ejercicios adecuado para usted. Qu debo saber sobre las pruebas de deteccin y los anlisis para prevenir cadas? Realizarse pruebas de deteccin y anlisis es la mejor manera de detectar un problema de salud de forma temprana. El diagnstico y tratamiento tempranos le brindan la mejor oportunidad de controlar las afecciones mdicas que son comunes despus de los 65 aos de edad. Ciertas afecciones y elecciones de estilo de vida pueden hacer que sea ms propenso a sufrir una cada. El mdico puede recomendarle lo siguiente: Controles regulares de la visin. Una visin deficiente y afecciones como las cataratas pueden hacer que sea ms propenso a sufrir una cada. Si usa lentes, asegrese de obtener una receta actualizada si su visin cambia. Revisin de medicamentos. Revise regularmente con el mdico todos los medicamentos que toma, incluidos los medicamentos de venta libre. Consulte al mdico sobre los efectos secundarios que pueden hacer que sea ms propenso a sufrir una cada. Informe al mdico si alguno de los medicamentos que toma lo hace sentir mareado o  somnoliento. Controles de fuerza y equilibrio. El mdico puede recomendar ciertos estudios para controlar su fuerza y equilibrio al estar de pie, al caminar o al cambiar de posicin. Examen de los pies. El dolor y el adormecimiento en los pies, como tambin no utilizar el calzado adecuado, pueden hacer que sea ms propenso a sufrir una cada. Pruebas de deteccin, que incluyen las siguientes: Pruebas de deteccin para la osteoporosis. La osteoporosis es una afeccin que hace que los huesos se tornen ms dbiles y se quiebren con ms facilidad. Pruebas de deteccin para la presin arterial. Los cambios en la presin arterial y los medicamentos para controlar la presin arterial pueden hacerlo sentir mareado. Prueba de deteccin de la depresin. Es ms probable que sufra una cada si tiene miedo a caerse, se siente deprimido o se siente incapaz de realizar actividades que sola hacer. Prueba de deteccin de consumo de alcohol. Beber demasiado alcohol puede afectar su equilibrio y puede hacer que sea ms propenso a sufrir una cada. Siga estas indicaciones en su casa: Estilo de vida No beba alcohol si: Su mdico le indica no hacerlo. Si bebe alcohol: Limite la cantidad que bebe a lo siguiente: De 0 a 1 medida por da para las mujeres. De 0 a 2 medidas por da para los hombres. Sepa cunta cantidad de alcohol hay en las bebidas que toma. En [place], una medida equivale a una botella de cerveza de 12 oz (355 ml), un vaso de vino de 5 oz (148 ml) o un vaso de una bebida alcohlica de alta graduacin de 1 oz (44 ml). No consuma ningn producto que   contenga nicotina o tabaco. Estos productos incluyen cigarrillos, tabaco para mascar y aparatos de vapeo, como los cigarrillos electrnicos. Si necesita ayuda para dejar de consumir estos productos, consulte al mdico. Actividad  Siga un programa de ejercicio regular para mantenerse en forma. Esto lo ayudar a mantener el equilibrio. Consulte al  mdico qu tipos de ejercicios son adecuados para usted. Si necesita un bastn o un andador, selo segn las recomendaciones del mdico. Utilice calzado con buen apoyo y suela antideslizante. Seguridad  Retire los objetos que puedan causar tropiezos tales como alfombras, cables u obstculos. Instale equipos de seguridad, como barras para sostn en los baos y barandas de seguridad en las escaleras. Mantenga las habitaciones y los pasillos bien iluminados. Indicaciones generales Hable con el mdico sobre sus riesgos de sufrir una cada. Infrmele a su mdico si: Se cae. Asegrese de informarle a su mdico acerca de todas las cadas, incluso aquellas que parecen ser menores. Se siente mareado, cansado (tiene fatiga) o siente que pierde el equilibrio. Use los medicamentos de venta libre y los recetados solamente como se lo haya indicado el mdico. Estos incluyen suplementos. Siga una dieta sana y mantenga un peso saludable. Una dieta saludable incluye productos lcteos descremados, carnes bajas en contenido de grasa (magras), fibra de granos enteros, frijoles y muchas frutas y verduras. Mantngase al da con las vacunas. Realcese los estudios de rutina de la salud, dentales y de la vista. Resumen Tener un estilo de vida saludable y recibir cuidados preventivos pueden ayudar a promover la salud y el bienestar despus de los 65 aos de edad. Realizarse pruebas de deteccin y anlisis es la mejor manera de detectar un problema de salud de forma temprana y ayudarlo a evitar una cada. El diagnstico y tratamiento tempranos le brindan la mejor oportunidad de controlar las afecciones mdicas ms comunes en las personas mayores de 65 aos de edad. Las cadas son la causa principal de las fracturas de huesos y lesiones en la cabeza de personas mayores de 65 aos de edad. Tome precauciones para evitar una cada en su casa. Trabaje con el mdico para saber qu cambios que puede hacer para mejorar su salud y  bienestar, y para prevenir las cadas. Esta informacin no tiene como fin reemplazar el consejo del mdico. Asegrese de hacerle al mdico cualquier pregunta que tenga. Document Revised: 01/22/2021 Document Reviewed: 01/22/2021 Elsevier Patient Education  2023 [employer]  

## 2022-01-29 NOTE — Assessment & Plan Note (Signed)
Negative cancer work-up. Continue Flomax 0.4 mg at bedtime. Stable and well-controlled.

## 2022-01-29 NOTE — Assessment & Plan Note (Signed)
Stable.  Diet and nutrition discussed.  Continue atorvastatin 10 mg daily. 

## 2022-01-29 NOTE — Progress Notes (Signed)
Joel Adkins 81 y.o.   Chief Complaint  Patient presents with   New Patient (Initial Visit)    HISTORY OF PRESENT ILLNESS: This is a 81 y.o. male first visit to this office, here to establish care with me. Has history of BPH.  Had recent urology work-up with negative prostate biopsies.  No cancer.  Takes Flomax 0.4 mg daily with good results. History of hypothyroidism on Synthroid 75 mcg daily. History of dyslipidemia on atorvastatin 10 mg daily. Originally from [place]. Non-smoker.  Physically active.  States he walks a lot. No complaints or any other medical concerns today.   HPI   Prior to Admission medications   Medication Sig Start Date End Date Taking? Authorizing Provider  atorvastatin (LIPITOR) 10 MG tablet Take 1 tablet (10 mg total) by mouth daily. 10/10/19   Ladell Pier, MD  benzonatate (TESSALON PERLES) 100 MG capsule Take 1 capsule (100 mg total) by mouth 3 (three) times daily as needed for cough. 10/10/19   Ladell Pier, MD  Glycerin-Hypromellose-PEG 400 ([facility] DRY EYE RELIEF) 0.2-0.2-1 % SOLN Place 1-2 drops into both eyes 3 (three) times daily as needed (for dry eyes.).    [provider]  tamsulosin (FLOMAX) 0.4 MG CAPS capsule Take 0.4 mg by mouth.    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   Procedure: TOTAL HIP ARTHROPLASTY ANTERIOR APPROACH;  Surgeon: Hessie Knows, MD;  Location: [facility] ORS;  Service: Orthopedics;  Laterality: Left;    Social History   Socioeconomic History    Marital status: Married    Spouse name: Not on file   Number of children: 2   Years of education: Not on file   Highest education level: Not on file  Occupational History   Not on file  Tobacco Use   Smoking status: Former    Types: Cigarettes    Quit date: 06/15/1964    Years since quitting: 25.6   Smokeless tobacco: Never  Vaping Use   Vaping Use: Never used  Substance and Sexual Activity   Alcohol use: No   Drug use: No   Sexual activity: Not on file  Other Topics Concern   Not on file  Social History Narrative   Not on file   Social Determinants of Health   Financial Resource Strain: Not on file  Food Insecurity: Not on file  Transportation Needs: Not on file  Physical Activity: Not on file  Stress: Not on file  Social Connections: Not on file  Intimate Partner Violence: Not on file    History reviewed. No pertinent family history.   Review of Systems  Constitutional: Negative.  Negative for chills and fever.  HENT: Negative.  Negative for congestion and sore throat.   Respiratory: Negative.  Negative for cough and shortness of breath.   Cardiovascular: Negative.  Negative for chest pain and palpitations.  Gastrointestinal: Negative.  Negative for abdominal pain, diarrhea, nausea and vomiting.  Skin: Negative.  Negative for rash.  All other systems reviewed and are  negative.  Today's Vitals   01/29/22 0925  BP: 120/62  Pulse: 82  Temp: 97.9 F (36.6 C)  TempSrc: Oral  SpO2: 98%  Weight: 164 lb 8 oz (74.6 kg)  Height: '5\' 4"'$  (1.626 m)   Body mass index is 28.24 kg/m.  Physical Exam Vitals reviewed.  Constitutional:      Appearance: Normal appearance.  HENT:     Head: Normocephalic.     Right Ear: Tympanic membrane, ear canal and external ear normal.     Left Ear: Tympanic membrane, ear canal and external ear normal.     Mouth/Throat:     Mouth: Mucous membranes are moist.     Pharynx: Oropharynx is clear.  Eyes:     Extraocular Movements:  Extraocular movements intact.     Conjunctiva/sclera: Conjunctivae normal.     Pupils: Pupils are equal, round, and reactive to light.  Cardiovascular:     Rate and Rhythm: Normal rate and regular rhythm.     Pulses: Normal pulses.     Heart sounds: Normal heart sounds.  Pulmonary:     Effort: Pulmonary effort is normal.     Breath sounds: Normal breath sounds.  Abdominal:     General: There is no distension.     Palpations: Abdomen is soft.     Tenderness: There is no abdominal tenderness.  Musculoskeletal:        General: Normal range of motion.     Cervical back: No tenderness.     Right lower leg: No edema.     Left lower leg: No edema.  Lymphadenopathy:     Cervical: No cervical adenopathy.  Skin:    General: Skin is warm and dry.     Capillary Refill: Capillary refill takes less than 2 seconds.  Neurological:     General: No focal deficit present.     Mental Status: He is alert and oriented to person, place, and time.  Psychiatric:        Mood and Affect: Mood normal.        Behavior: Behavior normal.     ASSESSMENT & PLAN: A total of 47 minutes was spent with the patient and counseling/coordination of care regarding preparing for this visit, establishing care, review of available medical records, review of multiple chronic medical problems and their management, review of all medications, need for blood work today, education on nutrition, cardiovascular risk associated with dyslipidemia, prognosis, documentation, and need for follow-up.  Problem List Items Addressed This Visit       Genitourinary   Benign prostatic hyperplasia without lower urinary tract symptoms    Negative cancer work-up. Continue Flomax 0.4 mg at bedtime. Stable and well-controlled.       Relevant Orders   CBC with Differential/Platelet     Other   Hyperlipidemia - Primary    Stable.  Diet and nutrition discussed.  Continue atorvastatin 10 mg daily.       Other Visit Diagnoses      Encounter to establish care       Hypothyroidism, unspecified type       Relevant Medications   levothyroxine (SYNTHROID) 75 MCG tablet   Other Relevant Orders   Thyroid Panel With TSH   CBC with Differential/Platelet      Patient Instructions  Mantenimiento de la salud despus de los [place] Maintenance After Age 1 Despus de los 65 aos de edad, corre un riesgo mayor de Insurance risk surveyor ciertas enfermedades e infecciones a Production designer, theatre/television/film  plazo, como tambin de sufrir lesiones por cadas. Las cadas son la causa principal de las fracturas de huesos y lesiones en la cabeza de personas mayores de 76 aos de edad. Recibir cuidados preventivos de forma regular puede ayudarlo a mantenerse saludable y en buen [place]. Los cuidados preventivos incluyen realizarse anlisis de forma regular y Actor en el estilo de vida segn las recomendaciones del mdico. Converse con el mdico sobre lo siguiente: Las pruebas de deteccin y los anlisis que debe Dispensing optician. Una prueba de deteccin es un estudio que se para Hydrographic surveyor la presencia de una enfermedad cuando no tiene sntomas. Un plan de dieta y ejercicios adecuado para usted. Qu debo saber sobre las pruebas de deteccin y los anlisis para prevenir cadas? Realizarse pruebas de deteccin y [employer] es la mejor manera de Hydrographic surveyor un problema de salud de forma temprana. El diagnstico y tratamiento tempranos le brindan la mejor oportunidad de Chief Technology Officer las afecciones mdicas que son comunes despus de los 30 aos de edad. Ciertas afecciones y elecciones de estilo de vida pueden hacer que sea ms propenso a sufrir Engineer, manufacturing. El mdico puede recomendarle lo siguiente: Controles regulares de la visin. Una visin deficiente y afecciones como las cataratas pueden hacer que sea ms propenso a sufrir Engineer, manufacturing. Si [place] lentes, asegrese de obtener una receta actualizada si su visin cambia. Revisin de medicamentos. Revise regularmente con el mdico todos  los medicamentos que toma, incluidos los medicamentos de [place]. Consulte al [employer] efectos secundarios que pueden hacer que sea ms propenso a sufrir Engineer, manufacturing. Informe al mdico si alguno de los medicamentos que toma lo hace sentir mareado o somnoliento. Controles de fuerza y equilibrio. El mdico puede recomendar ciertos estudios para controlar su fuerza y equilibrio al estar de pie, al caminar o al cambiar de posicin. Examen de los pies. El dolor y Chiropractor en los pies, como tambin no utilizar el calzado [place], pueden hacer que sea ms propenso a sufrir Engineer, manufacturing. Pruebas de deteccin, que incluyen las siguientes: Pruebas de deteccin para la osteoporosis. La osteoporosis es una afeccin que hace que los huesos se tornen ms dbiles y se quiebren con ms facilidad. Pruebas de deteccin para la presin arterial. Los cambios en la presin arterial y los medicamentos para Chief Technology Officer la presin arterial pueden hacerlo sentir mareado. Prueba de deteccin de la depresin. Es ms probable que sufra una cada si tiene miedo a caerse, se siente deprimido o se siente incapaz de Patent examiner. Prueba de deteccin de consumo de alcohol. Beber demasiado alcohol puede afectar su equilibrio y puede hacer que sea ms propenso a sufrir Engineer, manufacturing. Siga estas indicaciones en su casa: Estilo de vida No beba alcohol si: Su mdico le indica no hacerlo. Si bebe alcohol: Limite la cantidad que bebe a lo siguiente: De 0 a 1 medida por da para las mujeres. De 0 a 2 medidas por da para los hombres. Sepa cunta cantidad de alcohol hay en las bebidas que toma. En los [place], una medida equivale a una botella de cerveza de 12 oz (355 ml), un vaso de vino de 5 oz (148 ml) o un vaso de una bebida alcohlica de alta graduacin de 1 oz (44 ml). No consuma ningn producto que contenga nicotina o tabaco. Estos productos incluyen cigarrillos, tabaco para Higher education careers adviser y  aparatos de vapeo, como los Psychologist, sport and exercise. Si necesita ayuda para dejar de consumir estos productos, consulte al [employer]. Actividad  Siga  un programa de ejercicio regular para mantenerse en forma. Esto lo ayudar a Contractor equilibrio. Consulte al mdico qu tipos de ejercicios son adecuados para usted. Si necesita un bastn o un andador, selo segn las recomendaciones del mdico. Utilice calzado con buen apoyo y suela antideslizante. Seguridad  [place] los [place] puedan causar tropiezos tales como alfombras, cables u obstculos. Instale equipos de seguridad, como barras para sostn en los baos y barandas de seguridad en las escaleras. Pace habitaciones y los pasillos bien iluminados. Indicaciones generales Hable con el mdico sobre sus riesgos de sufrir una cada. Infrmele a su mdico si: Se cae. Asegrese de informarle a su mdico acerca de todas las cadas, incluso aquellas que parecen ser [employer] Se siente mareado, cansado (tiene fatiga) o siente que pierde el equilibrio. Use los medicamentos de venta libre y los recetados solamente como se lo haya indicado el mdico. Estos incluyen suplementos. Siga una dieta sana y [place] un peso saludable. Una dieta saludable incluye productos lcteos descremados, carnes bajas en contenido de grasa ([place]), fibra de granos enteros, frijoles y Jay frutas y verduras. [place]. Realcese los estudios de rutina de la salud, dentales y de Public librarian. Resumen Tener un estilo de vida saludable y recibir cuidados preventivos pueden ayudar a Theatre stage manager salud y el bienestar despus de los 81 aos de [place]. Realizarse pruebas de deteccin y [employer] es la mejor manera de Hydrographic surveyor un problema de salud de forma temprana y Lourena Simmonds a Product/process development scientist una cada. El diagnstico y tratamiento tempranos le brindan la mejor oportunidad de Chief Technology Officer las afecciones mdicas ms comunes en las personas mayores de 71 aos de  edad. Las cadas son la causa principal de las fracturas de huesos y lesiones en la cabeza de personas mayores de 62 aos de edad. Tome precauciones para evitar una cada en su casa. Trabaje con el mdico para saber qu cambios que puede hacer para mejorar su salud y [place], y [place]. Esta informacin no tiene Marine scientist el consejo del mdico. Asegrese de hacerle al mdico cualquier pregunta que tenga. Document Revised: 01/22/2021 Document Reviewed: 01/22/2021 Elsevier Patient Education  March ARB, MD [facility] [address] [facility]

## 2022-01-30 LAB — HEMOGLOBIN A1C
Est. average glucose Bld gHb Est-mCnc: 134 mg/dL
Hgb A1c MFr Bld: 6.3 % — ABNORMAL HIGH (ref 4.8–5.6)

## 2022-01-30 LAB — THYROID PANEL WITH TSH
Free Thyroxine Index: 2.8 (ref 1.4–3.8)
T3 Uptake: 32 % (ref 22–35)
T4, Total: 8.9 ug/dL (ref 4.9–10.5)
TSH: 2.02 mIU/L (ref 0.40–4.50)

## 2022-04-24 DIAGNOSIS — Z6832 Body mass index (BMI) 32.0-32.9, adult: Secondary | ICD-10-CM | POA: Diagnosis not present

## 2022-04-24 DIAGNOSIS — D1802 Hemangioma of intracranial structures: Secondary | ICD-10-CM | POA: Diagnosis not present

## 2022-05-21 DIAGNOSIS — D1802 Hemangioma of intracranial structures: Secondary | ICD-10-CM | POA: Diagnosis not present

## 2022-05-21 DIAGNOSIS — G9389 Other specified disorders of brain: Secondary | ICD-10-CM | POA: Diagnosis not present

## 2022-05-28 ENCOUNTER — Encounter: Payer: Self-pay | Admitting: Emergency Medicine

## 2022-05-28 ENCOUNTER — Ambulatory Visit (INDEPENDENT_AMBULATORY_CARE_PROVIDER_SITE_OTHER): Payer: Medicare HMO | Admitting: Emergency Medicine

## 2022-05-28 VITALS — BP 126/84 | HR 84 | Temp 98.4°F | Ht 64.0 in | Wt 161.0 lb

## 2022-05-28 DIAGNOSIS — E039 Hypothyroidism, unspecified: Secondary | ICD-10-CM | POA: Diagnosis not present

## 2022-05-28 DIAGNOSIS — E785 Hyperlipidemia, unspecified: Secondary | ICD-10-CM | POA: Diagnosis not present

## 2022-05-28 DIAGNOSIS — Z23 Encounter for immunization: Secondary | ICD-10-CM | POA: Diagnosis not present

## 2022-05-28 DIAGNOSIS — N400 Enlarged prostate without lower urinary tract symptoms: Secondary | ICD-10-CM

## 2022-05-28 LAB — URINALYSIS, ROUTINE W REFLEX MICROSCOPIC
Bilirubin Urine: NEGATIVE
Ketones, ur: NEGATIVE
Leukocytes,Ua: NEGATIVE
Nitrite: NEGATIVE
Specific Gravity, Urine: 1.01 (ref 1.000–1.030)
Total Protein, Urine: NEGATIVE
Urine Glucose: NEGATIVE
Urobilinogen, UA: 0.2 (ref 0.0–1.0)
pH: 7 (ref 5.0–8.0)

## 2022-05-28 MED ORDER — ATORVASTATIN CALCIUM 10 MG PO TABS: 10.0000 mg | ORAL_TABLET | Freq: Every day | ORAL | 5 refills | Status: DC

## 2022-05-28 MED ORDER — TAMSULOSIN HCL 0.4 MG PO CAPS: 0.4000 mg | ORAL_CAPSULE | Freq: Every day | ORAL | 3 refills | Status: DC

## 2022-05-28 MED ORDER — LEVOTHYROXINE SODIUM 75 MCG PO TABS: 75.0000 ug | ORAL_TABLET | Freq: Every day | ORAL | 3 refills | Status: DC

## 2022-05-28 NOTE — Assessment & Plan Note (Signed)
Clinically euthyroid with normal TSH and thyroid panel recently done. Continue Synthroid 75 mcg daily.

## 2022-05-28 NOTE — Assessment & Plan Note (Signed)
Stable.  Diet and nutrition discussed.  Continue atorvastatin 10 mg daily. 

## 2022-05-28 NOTE — Patient Instructions (Signed)
Mantenimiento de la salud despus de los 65 aos de edad Health Maintenance After Age 83 Despus de los 65 aos de edad, corre un riesgo mayor de padecer ciertas enfermedades e infecciones a largo plazo, como tambin de sufrir lesiones por cadas. Las cadas son la causa principal de las fracturas de huesos y lesiones en la cabeza de personas mayores de 65 aos de edad. Recibir cuidados preventivos de forma regular puede ayudarlo a mantenerse saludable y en buen estado. Los cuidados preventivos incluyen realizarse anlisis de forma regular y realizar cambios en el estilo de vida segn las recomendaciones del mdico. Converse con el mdico sobre lo siguiente: Las pruebas de deteccin y los anlisis que debe realizarse. Una prueba de deteccin es un estudio que se para detectar la presencia de una enfermedad cuando no tiene sntomas. Un plan de dieta y ejercicios adecuado para usted. Qu debo saber sobre las pruebas de deteccin y los anlisis para prevenir cadas? Realizarse pruebas de deteccin y anlisis es la mejor manera de detectar un problema de salud de forma temprana. El diagnstico y tratamiento tempranos le brindan la mejor oportunidad de controlar las afecciones mdicas que son comunes despus de los 65 aos de edad. Ciertas afecciones y elecciones de estilo de vida pueden hacer que sea ms propenso a sufrir una cada. El mdico puede recomendarle lo siguiente: Controles regulares de la visin. Una visin deficiente y afecciones como las cataratas pueden hacer que sea ms propenso a sufrir una cada. Si usa lentes, asegrese de obtener una receta actualizada si su visin cambia. Revisin de medicamentos. Revise regularmente con el mdico todos los medicamentos que toma, incluidos los medicamentos de venta libre. Consulte al mdico sobre los efectos secundarios que pueden hacer que sea ms propenso a sufrir una cada. Informe al mdico si alguno de los medicamentos que toma lo hace sentir mareado o  somnoliento. Controles de fuerza y equilibrio. El mdico puede recomendar ciertos estudios para controlar su fuerza y equilibrio al estar de pie, al caminar o al cambiar de posicin. Examen de los pies. El dolor y el adormecimiento en los pies, como tambin no utilizar el calzado adecuado, pueden hacer que sea ms propenso a sufrir una cada. Pruebas de deteccin, que incluyen las siguientes: Pruebas de deteccin para la osteoporosis. La osteoporosis es una afeccin que hace que los huesos se tornen ms dbiles y se quiebren con ms facilidad. Pruebas de deteccin para la presin arterial. Los cambios en la presin arterial y los medicamentos para controlar la presin arterial pueden hacerlo sentir mareado. Prueba de deteccin de la depresin. Es ms probable que sufra una cada si tiene miedo a caerse, se siente deprimido o se siente incapaz de realizar actividades que sola hacer. Prueba de deteccin de consumo de alcohol. Beber demasiado alcohol puede afectar su equilibrio y puede hacer que sea ms propenso a sufrir una cada. Siga estas indicaciones en su casa: Estilo de vida No beba alcohol si: Su mdico le indica no hacerlo. Si bebe alcohol: Limite la cantidad que bebe a lo siguiente: De 0 a 1 medida por da para las mujeres. De 0 a 2 medidas por da para los hombres. Sepa cunta cantidad de alcohol hay en las bebidas que toma. En [place], una medida equivale a una botella de cerveza de 12 oz (355 ml), un vaso de vino de 5 oz (148 ml) o un vaso de una bebida alcohlica de alta graduacin de 1 oz (44 ml). No consuma ningn producto que   contenga nicotina o tabaco. Estos productos incluyen cigarrillos, tabaco para mascar y aparatos de vapeo, como los cigarrillos electrnicos. Si necesita ayuda para dejar de consumir estos productos, consulte al mdico. Actividad  Siga un programa de ejercicio regular para mantenerse en forma. Esto lo ayudar a mantener el equilibrio. Consulte al  mdico qu tipos de ejercicios son adecuados para usted. Si necesita un bastn o un andador, selo segn las recomendaciones del mdico. Utilice calzado con buen apoyo y suela antideslizante. Seguridad  Retire los objetos que puedan causar tropiezos tales como alfombras, cables u obstculos. Instale equipos de seguridad, como barras para sostn en los baos y barandas de seguridad en las escaleras. Mantenga las habitaciones y los pasillos bien iluminados. Indicaciones generales Hable con el mdico sobre sus riesgos de sufrir una cada. Infrmele a su mdico si: Se cae. Asegrese de informarle a su mdico acerca de todas las cadas, incluso aquellas que parecen ser menores. Se siente mareado, cansado (tiene fatiga) o siente que pierde el equilibrio. Use los medicamentos de venta libre y los recetados solamente como se lo haya indicado el mdico. Estos incluyen suplementos. Siga una dieta sana y mantenga un peso saludable. Una dieta saludable incluye productos lcteos descremados, carnes bajas en contenido de grasa (magras), fibra de granos enteros, frijoles y muchas frutas y verduras. Mantngase al da con las vacunas. Realcese los estudios de rutina de la salud, dentales y de la vista. Resumen Tener un estilo de vida saludable y recibir cuidados preventivos pueden ayudar a promover la salud y el bienestar despus de los 65 aos de edad. Realizarse pruebas de deteccin y anlisis es la mejor manera de detectar un problema de salud de forma temprana y ayudarlo a evitar una cada. El diagnstico y tratamiento tempranos le brindan la mejor oportunidad de controlar las afecciones mdicas ms comunes en las personas mayores de 65 aos de edad. Las cadas son la causa principal de las fracturas de huesos y lesiones en la cabeza de personas mayores de 65 aos de edad. Tome precauciones para evitar una cada en su casa. Trabaje con el mdico para saber qu cambios que puede hacer para mejorar su salud y  bienestar, y para prevenir las cadas. Esta informacin no tiene como fin reemplazar el consejo del mdico. Asegrese de hacerle al mdico cualquier pregunta que tenga. Document Revised: 01/22/2021 Document Reviewed: 01/22/2021 Elsevier Patient Education  2023 [employer]  

## 2022-05-28 NOTE — Progress Notes (Signed)
Joel Adkins 81 y.o.   Chief Complaint  Patient presents with   Follow-up    9mth f/u appt no concerns     HISTORY OF PRESENT ILLNESS: This is a 81y.o. male here for 35-monthollow-up of chronic medical problems. Doing well.  Has no complaints or medical concerns today. However wife states she has noted red urine.  HPI   Prior to Admission medications   Medication Sig Start Date End Date Taking? Authorizing Provider  Glycerin-Hypromellose-PEG 400 0.2-0.2-1 % SOLN Place 1-2 drops into both eyes 3 (three) times daily as needed (for dry eyes.).   Yes [provider]  atorvastatin (LIPITOR) 10 MG tablet Take 1 tablet (10 mg total) by mouth daily. 05/28/22   SaHorald PollenMD  levothyroxine (SYNTHROID) 75 MCG tablet Take 1 tablet (75 mcg total) by mouth daily before breakfast. 05/28/22   SaHorald PollenMD  tamsulosin (FLOMAX) 0.4 MG CAPS capsule Take 1 capsule (0.4 mg total) by mouth at bedtime. 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   Procedure: TOTAL HIP ARTHROPLASTY ANTERIOR APPROACH;  Surgeon: MeHessie KnowsMD;  Location: [facility] ORS;  Service: Orthopedics;  Laterality: Left;    Social History   Socioeconomic History   Marital status: Married    Spouse name: Not on file   Number of children: 2   Years of education: Not on file   Highest education level: Not on file  Occupational History   Not on file  Tobacco Use   Smoking status: Former    Types: Cigarettes     Quit date: 06/15/1964    Years since quitting: 57.9   Smokeless tobacco: Never  Vaping Use   Vaping Use: Never used  Substance and Sexual Activity   Alcohol use: No   Drug use: No   Sexual activity: Not Currently  Other Topics Concern   Not on file  Social History Narrative   Not on file   Social Determinants of Health   Financial Resource Strain: Not on file  Food Insecurity: Not on file  Transportation Needs: Not on file  Physical Activity: Not on file  Stress: Not on file  Social Connections: Not on file  Intimate Partner Violence: Not on file    No family history on file.   Review of Systems  Constitutional: Negative.  Negative for chills and fever.  HENT: Negative.  Negative for congestion and sore throat.   Respiratory: Negative.  Negative for cough and shortness of breath.   Cardiovascular: Negative.  Negative for chest pain and palpitations.  Gastrointestinal:  Negative for abdominal pain, nausea and vomiting.  Genitourinary: Negative.   Skin: Negative.  Negative for rash.  Neurological:  Negative for dizziness and headaches.  All other systems reviewed and are negative.  Today's Vitals   05/28/22 0927  BP: 126/84  Pulse: 84  Temp: 98.4 F (36.9 C)  TempSrc: Oral  SpO2: 91%  Weight: 161 lb (73 kg)  Height:  $'5\' 4"'b$  (1.626 m)   Body mass index is 27.64 kg/m.   Physical Exam Vitals reviewed.  Constitutional:      Appearance: Normal appearance.  HENT:     Head: Normocephalic.     Mouth/Throat:     Mouth: Mucous membranes are moist.     Pharynx: Oropharynx is clear.  Eyes:     Extraocular Movements: Extraocular movements intact.     Conjunctiva/sclera: Conjunctivae normal.     Pupils: Pupils are equal, round, and reactive to light.  Cardiovascular:     Rate and Rhythm: Normal rate and regular rhythm.     Pulses: Normal pulses.     Heart sounds: Normal heart sounds.  Pulmonary:     Effort: Pulmonary effort is normal.     Breath sounds:  Normal breath sounds.  Abdominal:     Palpations: Abdomen is soft.     Tenderness: There is no abdominal tenderness.  Musculoskeletal:     Cervical back: No tenderness.     Right lower leg: No edema.     Left lower leg: No edema.  Lymphadenopathy:     Cervical: No cervical adenopathy.  Skin:    General: Skin is warm and dry.  Neurological:     General: No focal deficit present.     Mental Status: He is alert and oriented to person, place, and time.  Psychiatric:        Mood and Affect: Mood normal.        Behavior: Behavior normal.      ASSESSMENT & PLAN: A total of 42 minutes was spent with the patient and counseling/coordination of care regarding preparing for this visit, review of most recent office visit notes, review of multiple chronic medical problems and their management, review of all medications, education on nutrition, review of health maintenance items, prognosis, documentation, and need for follow-up.  Problem List Items Addressed This Visit       Endocrine   Hypothyroidism    Clinically euthyroid with normal TSH and thyroid panel recently done. Continue Synthroid 75 mcg daily.      Relevant Medications   levothyroxine (SYNTHROID) 75 MCG tablet     Genitourinary   Benign prostatic hyperplasia without lower urinary tract symptoms    Stable.  Continue tamsulosin 0.4 mg daily. History of red urine. We will do urinalysis and urine culture today.      Relevant Medications   tamsulosin (FLOMAX) 0.4 MG CAPS capsule   Other Relevant Orders   Urinalysis   Urine Culture     Other   Dyslipidemia - Primary    Stable.  Diet and nutrition discussed.  Continue atorvastatin 10 mg daily.      Relevant Medications   atorvastatin (LIPITOR) 10 MG tablet   Other Visit Diagnoses     Need for vaccination       Relevant Orders   Flu Vaccine QUAD High Dose(Fluad) (Completed)      Patient Instructions  Mantenimiento de la salud despus de los 21 aos de  edad Health Maintenance After Age 66 Despus de los 65 aos de edad, corre un riesgo mayor de Tourist information centre manager enfermedades e infecciones a Barrister's clerk, como tambin de sufrir lesiones por cadas. Las cadas son la causa principal de las fracturas de huesos y lesiones en la cabeza de personas mayores de 85 aos de edad. Recibir cuidados preventivos de forma regular puede ayudarlo a mantenerse saludable y en buen [place]. Los cuidados preventivos incluyen realizarse anlisis de forma  regular y Paramedic de vida segn las recomendaciones del mdico. Converse con el mdico sobre lo siguiente: Las pruebas de deteccin y los anlisis que debe Dispensing optician. Una prueba de deteccin es un estudio que se para Hydrographic surveyor la presencia de una enfermedad cuando no tiene sntomas. Un plan de dieta y ejercicios adecuado para usted. Qu debo saber sobre las pruebas de deteccin y los anlisis para prevenir cadas? Realizarse pruebas de deteccin y [employer] es la mejor manera de Hydrographic surveyor un problema de salud de forma temprana. El diagnstico y tratamiento tempranos le brindan la mejor oportunidad de Chief Technology Officer las afecciones mdicas que son comunes despus de los 104 aos de edad. Ciertas afecciones y elecciones de estilo de vida pueden hacer que sea ms propenso a sufrir Engineer, manufacturing. El mdico puede recomendarle lo siguiente: Controles regulares de la visin. Una visin deficiente y afecciones como las cataratas pueden hacer que sea ms propenso a sufrir Engineer, manufacturing. Si [place] lentes, asegrese de obtener una receta actualizada si su visin cambia. Revisin de medicamentos. Revise regularmente con el mdico todos los medicamentos que toma, incluidos los medicamentos de [place]. Consulte al [employer] efectos secundarios que pueden hacer que sea ms propenso a sufrir Engineer, manufacturing. Informe al mdico si alguno de los medicamentos que toma lo hace sentir mareado o somnoliento. Controles de fuerza y equilibrio. El  mdico puede recomendar ciertos estudios para controlar su fuerza y equilibrio al estar de pie, al caminar o al cambiar de posicin. Examen de los pies. El dolor y Chiropractor en los pies, como tambin no utilizar el calzado [place], pueden hacer que sea ms propenso a sufrir Engineer, manufacturing. Pruebas de deteccin, que incluyen las siguientes: Pruebas de deteccin para la osteoporosis. La osteoporosis es una afeccin que hace que los huesos se tornen ms dbiles y se quiebren con ms facilidad. Pruebas de deteccin para la presin arterial. Los cambios en la presin arterial y los medicamentos para Chief Technology Officer la presin arterial pueden hacerlo sentir mareado. Prueba de deteccin de la depresin. Es ms probable que sufra una cada si tiene miedo a caerse, se siente deprimido o se siente incapaz de Patent examiner. Prueba de deteccin de consumo de alcohol. Beber demasiado alcohol puede afectar su equilibrio y puede hacer que sea ms propenso a sufrir Engineer, manufacturing. Siga estas indicaciones en su casa: Estilo de vida No beba alcohol si: Su mdico le indica no hacerlo. Si bebe alcohol: Limite la cantidad que bebe a lo siguiente: De 0 a 1 medida por da para las mujeres. De 0 a 2 medidas por da para los hombres. Sepa cunta cantidad de alcohol hay en las bebidas que toma. En los [place], una medida equivale a una botella de cerveza de 12 oz (355 ml), un vaso de vino de 5 oz (148 ml) o un vaso de una bebida alcohlica de alta graduacin de 1 oz (44 ml). No consuma ningn producto que contenga nicotina o tabaco. Estos productos incluyen cigarrillos, tabaco para Higher education careers adviser y aparatos de vapeo, como los Psychologist, sport and exercise. Si necesita ayuda para dejar de consumir estos productos, consulte al [employer]. Actividad  Siga un programa de ejercicio regular para mantenerse en forma. Esto lo ayudar a Contractor equilibrio. Consulte al mdico qu tipos de ejercicios son adecuados para  usted. Si necesita un bastn o un andador, selo segn las recomendaciones del mdico. Utilice calzado con buen apoyo y suela antideslizante. Seguridad  Retire los [place] puedan  causar tropiezos tales como alfombras, cables u obstculos. Instale equipos de seguridad, como barras para sostn en los baos y barandas de seguridad en las escaleras. [place] habitaciones y los pasillos bien iluminados. Indicaciones generales Hable con el mdico sobre sus riesgos de sufrir una cada. Infrmele a su mdico si: Se cae. Asegrese de informarle a su mdico acerca de todas las cadas, incluso aquellas que parecen ser [employer] Se siente mareado, cansado (tiene fatiga) o siente que pierde el equilibrio. Use los medicamentos de venta libre y los recetados solamente como se lo haya indicado el mdico. Estos incluyen suplementos. Siga una dieta sana y [place] un peso saludable. Una dieta saludable incluye productos lcteos descremados, carnes bajas en contenido de grasa ([place]), fibra de granos enteros, frijoles y [place] frutas y verduras. [place]. Realcese los estudios de rutina de la salud, dentales y de Public librarian. Resumen Tener un estilo de vida saludable y recibir cuidados preventivos pueden ayudar a Theatre stage manager salud y el bienestar despus de los 54 aos de [place]. Realizarse pruebas de deteccin y [employer] es la mejor manera de Hydrographic surveyor un problema de salud de forma temprana y Lourena Simmonds a Product/process development scientist una cada. El diagnstico y tratamiento tempranos le brindan la mejor oportunidad de Chief Technology Officer las afecciones mdicas ms comunes en las personas mayores de 35 aos de edad. Las cadas son la causa principal de las fracturas de huesos y lesiones en la cabeza de personas mayores de 81 aos de edad. Tome precauciones para evitar una cada en su casa. Trabaje con el mdico para saber qu cambios que puede hacer para mejorar su salud y [place], y [place]. Esta  informacin no tiene Marine scientist el consejo del mdico. Asegrese de hacerle al mdico cualquier pregunta que tenga. Document Revised: 01/22/2021 Document Reviewed: 01/22/2021 Elsevier Patient Education  [place], MD [facility]

## 2022-05-28 NOTE — Assessment & Plan Note (Signed)
Stable.  Continue tamsulosin 0.4 mg daily. History of red urine. We will do urinalysis and urine culture today.

## 2022-05-29 LAB — URINE CULTURE

## 2022-05-30 ENCOUNTER — Encounter: Payer: Self-pay | Admitting: Emergency Medicine

## 2022-05-30 ENCOUNTER — Other Ambulatory Visit: Payer: Self-pay | Admitting: Emergency Medicine

## 2022-05-30 DIAGNOSIS — R319 Hematuria, unspecified: Secondary | ICD-10-CM

## 2022-07-08 DIAGNOSIS — R972 Elevated prostate specific antigen [PSA]: Secondary | ICD-10-CM | POA: Diagnosis not present

## 2022-07-08 DIAGNOSIS — R310 Gross hematuria: Secondary | ICD-10-CM | POA: Diagnosis not present

## 2022-07-28 DIAGNOSIS — R310 Gross hematuria: Secondary | ICD-10-CM | POA: Diagnosis not present

## 2022-07-28 DIAGNOSIS — K5730 Diverticulosis of large intestine without perforation or abscess without bleeding: Secondary | ICD-10-CM | POA: Diagnosis not present

## 2022-07-28 DIAGNOSIS — K769 Liver disease, unspecified: Secondary | ICD-10-CM | POA: Diagnosis not present

## 2022-07-28 DIAGNOSIS — N3289 Other specified disorders of bladder: Secondary | ICD-10-CM | POA: Diagnosis not present

## 2022-07-31 HISTORY — PX: CYSTOSCOPY: SUR368

## 2022-08-10 DIAGNOSIS — R310 Gross hematuria: Secondary | ICD-10-CM | POA: Diagnosis not present

## 2022-08-10 DIAGNOSIS — C678 Malignant neoplasm of overlapping sites of bladder: Secondary | ICD-10-CM | POA: Diagnosis not present

## 2022-08-12 ENCOUNTER — Other Ambulatory Visit: Payer: Self-pay | Admitting: Urology

## 2022-08-20 ENCOUNTER — Ambulatory Visit: Payer: Medicare HMO | Admitting: Emergency Medicine

## 2022-08-26 NOTE — H&P (Signed)
Patient is a 81 year old Hispanic male seen today for evaluation of painless gross hematuria. Patient states over the last month to 6 weeks has had intermittent painless gross hematuria. No passage of clots. No significant voiding issues or pain. No prior history of stones. Remote mild social smoking history greater than 30 years ago but nothing significant by his report. Denies any dysuria flank pain. Here for evaluation management last visible hematuria was 2 days ago. Review of his chart records show that he had had history of of marginal PSA elevation but this is normalized with last number being 3.55 in April 2021. No family history of prostate cancer.  Micro urinalysis reveals 10-20 RBCs  -08/10/22-patient with history of painless gross hematuria as above. Has had CT renal scan in the interim which unfortunately shows filling defects within the urinary bladder which may represent urothelial carcinoma. See report below. Here for cystoscopy to assess bladder. Also severe prostamegaly.  Micro urinalysis shows 20-40 RBCs  Cystoscopy is performed today and shows: Marked trilobar prostatic hypertrophy with about 3 and half to 4 cm prostate urethral length. Bladder is markedly trabeculated with numerous cellules. There is a large 3 to 4 cm exophytic bladder mass along the right posterior lateral bladder wall and adjacent smaller tumor measuring probably additional 2 cm adjacent mass. Ureteral orifice ease were difficult to visualize.    CLINICAL DATA: 81 year old male with history of intermittent gross  hematuria for the past 6 weeks. No current abdominal pain, nausea,  vomiting, fever or weight loss. No history of kidney stones.   EXAM:  CT ABDOMEN AND PELVIS WITHOUT AND WITH CONTRAST   TECHNIQUE:  Multidetector CT imaging of the abdomen and pelvis was performed  following the standard protocol before and following the bolus  administration of intravenous contrast.   RADIATION DOSE REDUCTION: This  exam was performed according to the  departmental dose-optimization program which includes automated  exposure control, adjustment of the mA and/or kV according to  patient size and/or use of iterative reconstruction technique.   CONTRAST: 125 mL of Omnipaque 300.   COMPARISON: No priors.   FINDINGS:  Lower chest: Mild calcifications of the aortic valve.   Hepatobiliary: Subcentimeter low-attenuation lesion in segment 3 of  the liver, too small to definitively characterize, but statistically  likely a tiny cyst or biliary hamartoma (no imaging follow-up  recommended). No aggressive appearing hepatic lesions. No intra or  extrahepatic biliary ductal dilatation. Gallbladder is normal in  appearance.   Pancreas: No pancreatic mass. No pancreatic ductal dilatation. No  pancreatic or peripancreatic fluid collections or inflammatory  changes.   Spleen: Unremarkable.   Adrenals/Urinary Tract: No calcifications are identified within the  collecting system of either kidney or along the course of either  ureter. No hydroureteronephrosis. Postcontrast images demonstrate  normal appearance of the kidneys and bilateral adrenal glands.  Postcontrast delayed images demonstrate no definite filling defect  within the collecting system of either kidney or along the course of  either ureter to suggest the presence of upper tract urothelial  neoplasm. Urinary bladder is diffusely thickened with multiple  bladder wall diverticuli, largest of which is located  anterolaterally on the left side measuring up to 3.8 x 2.3 cm (axial  image 55 of series 5). Along the right-side of the urinary bladder  there is mass-like soft tissue thickening which demonstrates  enhancement on postcontrast images (axial image 61 of series 5 and  coronal image 41 of series 604) measuring 3.0 x 1.8 x 4.7  cm, highly  concerning for primary urothelial neoplasm.   Stomach/Bowel: The appearance of the stomach is normal. There  is no  pathologic dilatation of small bowel or colon. Numerous colonic  diverticula are noted, particularly in the descending colon and  sigmoid colon, without surrounding inflammatory changes to suggest  an acute diverticulitis at this time. Normal appendix.   Vascular/Lymphatic: Atherosclerosis in the abdominal aorta and  pelvic vasculature. No lymphadenopathy noted in the abdomen or  pelvis.   Reproductive: Prostate gland is markedly enlarged in very  heterogeneous in appearance estimated to measure approximately 7.1 x  6.6 x 9.4 cm. The right lobe of the gland is enlarged greater than  the left lobe of the gland, particularly the right peripheral zone.  There is also substantial median lobe hypertrophy. Seminal vesicles  are grossly unremarkable in appearance.   Other: No significant volume of ascites. No pneumoperitoneum.   Musculoskeletal: There are no aggressive appearing lytic or blastic  lesions noted in the visualized portions of the skeleton. Status  post left hip arthroplasty.   IMPRESSION:  1. 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  2. Severe prostatomegaly which is asymmetric involving the right  lobe of the gland (particularly the peripheral zone) to greater  extent than the left. Correlation with PSA levels is recommended.  3. 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  4. Colonic diverticulosis without evidence of acute diverticulitis  at this time.  5. There are calcifications of the aortic valve. Echocardiographic  correlation for evaluation of potential valvular dysfunction may be  warranted if clinically indicated.  6. Aortic atherosclerosis.  7. Additional incidental findings, as above.    Electronically Signed  By: Vinnie Langton M.D.   On: 07/29/2022 07:46      ALLERGIES: No Allergies    MEDICATIONS: Atorvastatin Calcium 10 mg tablet 1 tablet PO Daily  Tamsulosin Hcl 0.4 mg capsule 1 capsule PO Daily     GU PSH: Locm 300-'399Mg'$ /Ml Iodine,1Ml - 07/28/2022     NON-GU PSH: No Non-GU PSH    GU PMH: Gross hematuria - 07/28/2022, - 07/08/2022 Elevated PSA - 07/08/2022    NON-GU PMH: No Non-GU PMH    FAMILY HISTORY: No Family History    SOCIAL HISTORY: Marital Status: Unknown Ethnicity:  Current Smoking Status: Patient has never smoked.   Tobacco Use Assessment Completed: Used Tobacco in last 30 days? Has never drank.  Drinks 2 caffeinated drinks per day.    REVIEW OF SYSTEMS:    GU Review Male:   Patient denies frequent urination, hard to postpone urination, burning/ pain with urination, get up at night to urinate, leakage of urine, stream starts and stops, trouble starting your stream, have to strain to urinate , erection problems, and penile pain.  Gastrointestinal (Upper):   Patient denies nausea, vomiting, and indigestion/ heartburn.  Gastrointestinal (Lower):   Patient denies diarrhea and constipation.  Constitutional:   Patient denies fever, night sweats, weight loss, and fatigue.  Skin:   Patient denies skin rash/ lesion and itching.  Eyes:   Patient denies blurred vision and double vision.  Ears/ Nose/ Throat:   Patient denies sore throat and sinus problems.  Hematologic/Lymphatic:   Patient denies swollen glands and easy bruising.  Cardiovascular:   Patient denies leg swelling and chest pains.  Respiratory:  Patient denies cough and shortness of breath.  Endocrine:   Patient denies excessive thirst.  Musculoskeletal:   Patient denies back pain and joint pain.  Neurological:   Patient denies headaches and dizziness.  Psychologic:   Patient denies depression and anxiety.   VITAL SIGNS: None   GU PHYSICAL EXAMINATION:    Testes: No tenderness, no swelling, no enlargement left testes. No  tenderness, no swelling, no enlargement right testes. Normal location left testes. Normal location right testes. No mass, no cyst, no varicocele, no hydrocele left testes. No mass, no cyst, no varicocele, no hydrocele right testes.  Urethral Meatus: Normal size. No lesion, no wart, no discharge, no polyp. Normal location.  Penis: Circumcised, no warts, no cracks. No dorsal Peyronie's plaques, no left corporal Peyronie's plaques, no right corporal Peyronie's plaques, no scarring, no warts. No balanitis, no meatal stenosis.   MULTI-SYSTEM PHYSICAL EXAMINATION:    Constitutional: Well-nourished. No physical deformities. Normally developed. Good grooming.  Neck: Neck symmetrical, not swollen. Normal tracheal position.  Respiratory: No labored breathing, no use of accessory muscles.   Cardiovascular: Normal temperature, normal extremity pulses, no swelling, no varicosities.  Lymphatic: No enlargement of neck, axillae, groin.  Skin: No paleness, no jaundice, no cyanosis. No lesion, no ulcer, no rash.  Neurologic / Psychiatric: Oriented to time, oriented to place, oriented to person. No depression, no anxiety, no agitation.  Gastrointestinal: No mass, no tenderness, no rigidity, non obese abdomen.  Eyes: Normal conjunctivae. Normal eyelids.  Ears, Nose, Mouth, and Throat: Left ear no scars, no lesions, no masses. Right ear no scars, no lesions, no masses. Nose no scars, no lesions, no masses. Normal hearing. Normal lips.  Musculoskeletal: Normal gait and station of head and neck.     PAST DATA REVIEW: None   PROCEDURES:         Flexible Cystoscopy - 52000  Risks, benefits, and some of the potential complications of the procedure were discussed at length with the patient including infection, bleeding, voiding discomfort, urinary retention, fever, chills, sepsis, and others. All questions were answered. Informed consent was obtained. Antibiotic prophylaxis was given. Sterile technique and intraurethral  analgesia were used.  Meatus:  Normal size. Normal location. Normal condition.  Urethra:  No strictures.  External Sphincter:  Normal.  Verumontanum:  Normal.  Prostate:  Enlarged median lobe. Severe hyperplasia.  Bladder Neck:  Non-obstructing.  Bladder:  Cystoscopy is performed today and shows: Marked trilobar prostatic hypertrophy with about 3 and half to 4 cm prostate urethral length. Bladder is markedly trabeculated with numerous cellules. There is a large 3 to 4 cm exophytic bladder mass along the right posterior lateral bladder wall and adjacent smaller tumor measuring probably additional 2 cm adjacent mass. Ureteral orifice ease were difficult to visualize.      The lower urinary tract was carefully examined. The procedure was well-tolerated and without complications. Antibiotic instructions were given. Instructions were given to call the office immediately for bloody urine, difficulty urinating, urinary retention, painful or frequent urination, fever, chills, nausea, vomiting or other illness. The patient stated that he understood these instructions and would comply with them.         Urinalysis w/Scope - 81001 Dipstick Dipstick Cont'd Micro  Specimen: Voided Bilirubin: Neg WBC/hpf: 0 - 5/hpf  Color: Red Ketones: Neg RBC/hpf: 40-60/hpf  Appearance: Cloudy Blood: 3+ Bacteria: NS (Not Seen)  Specific Gravity: 1.015 Protein: Trace Cystals: NS (Not Seen)  pH: 6.0 Urobilinogen: 0.2 Casts: NS (Not Seen)  Glucose: Neg Nitrites:  Neg Trichomonas: Not Present    Leukocyte Esterase: Neg Mucous: Not Present      Epithelial Cells: NS (Not Seen)      Yeast: NS (Not Seen)      Sperm: Not Present    ASSESSMENT:      ICD-10 Details  1 GU:   Gross hematuria - N36.1 Acute, Complicated Injury  2   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 Recommended cystoscopy TURBT with retrogrades, risk and benefits discussed as outlined below. In addition added finasteride 5 mg daily to the current tamsulosin 0.4 mg daily for BPH.  TURBT consent: I have discussed with the patient the risks, benefits of TURBT which include but are not limited to: Bleeding, infection, damage to the bladder with potential perforation of the bladder, damage to surrounding organs, possible need for further procedures including open repair and catheterization, possibility of nonhealing area within the bladder, urgency, frequency which may be refractory to medications. I pointed out that in some occasions after resection of the bladder tumor, mitomycin-C chemotherapy may be instilled into the bladder. The risks associated with this therapy include but are not limited to: Refractory or new onset urgency, frequency, dysuria, infrequently severe systemic side effects secondary to mitomycin-C. After full discussion of the risks, benefits and alternatives, the patient has consented to the above procedure and desires to proceed.

## 2022-08-27 ENCOUNTER — Ambulatory Visit (INDEPENDENT_AMBULATORY_CARE_PROVIDER_SITE_OTHER): Payer: Medicare HMO | Admitting: Emergency Medicine

## 2022-08-27 ENCOUNTER — Encounter (HOSPITAL_BASED_OUTPATIENT_CLINIC_OR_DEPARTMENT_OTHER): Payer: Self-pay | Admitting: Urology

## 2022-08-27 ENCOUNTER — Encounter: Payer: Self-pay | Admitting: Emergency Medicine

## 2022-08-27 VITALS — BP 122/78 | HR 86 | Temp 98.0°F | Ht 64.0 in | Wt 166.6 lb

## 2022-08-27 DIAGNOSIS — N400 Enlarged prostate without lower urinary tract symptoms: Secondary | ICD-10-CM | POA: Diagnosis not present

## 2022-08-27 DIAGNOSIS — E039 Hypothyroidism, unspecified: Secondary | ICD-10-CM

## 2022-08-27 DIAGNOSIS — C678 Malignant neoplasm of overlapping sites of bladder: Secondary | ICD-10-CM | POA: Diagnosis not present

## 2022-08-27 DIAGNOSIS — E785 Hyperlipidemia, unspecified: Secondary | ICD-10-CM | POA: Diagnosis not present

## 2022-08-27 NOTE — Assessment & Plan Note (Signed)
Stable.  Diet and nutrition discussed.  Continue atorvastatin 10 mg daily.

## 2022-08-27 NOTE — Assessment & Plan Note (Signed)
Continue tamsulosin 0.4 mg Recently started on finasteride 5 mg daily

## 2022-08-27 NOTE — Assessment & Plan Note (Signed)
Stable.  Occasional hematuria continues Scheduled for surgery next week.

## 2022-08-27 NOTE — Progress Notes (Signed)
Joel Adkins 81 y.o.   Chief Complaint  Patient presents with   Follow-up    3 month f/u    HISTORY OF PRESENT ILLNESS: This is a 81 y.o. male here for 62-monthfollow-up Was referred to urologist for evaluation of painless hematuria.  Had cystoscopy done 2 weeks ago with diagnosis of bladder cancer.  Scheduled for surgery next week. No other complaints or medical concerns today.  HPI   Prior to Admission medications   Medication Sig Start Date End Date Taking? Authorizing Provider  atorvastatin (LIPITOR) 10 MG tablet Take 1 tablet (10 mg total) by mouth daily. 05/28/22  Yes Lashante Fryberger, MInes Bloomer MD  finasteride (PROSCAR) 5 MG tablet Take 5 mg by mouth daily. 08/10/22  Yes [provider]  Glycerin-Hypromellose-PEG 400 0.2-0.2-1 % SOLN Place 1-2 drops into both eyes 3 (three) times daily as needed (for dry eyes.).   Yes [provider]  levothyroxine (SYNTHROID) 75 MCG tablet Take 1 tablet (75 mcg total) by mouth daily before breakfast. 05/28/22  Yes Lounell Schumacher, MInes Bloomer MD  tamsulosin (FLOMAX) 0.4 MG CAPS capsule Take 1 capsule (0.4 mg total) by mouth at bedtime. 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   Procedure: TOTAL HIP ARTHROPLASTY ANTERIOR APPROACH;  Surgeon: MHessie Knows MD;  Location: [facility] ORS;  Service: Orthopedics;  Laterality: Left;    Social History    Socioeconomic History   Marital status: Married    Spouse name: Not on file   Number of children: 2   Years of education: Not on file   Highest education level: Not on file  Occupational History   Not on file  Tobacco Use   Smoking status: Former    Types: Cigarettes    Quit date: 06/15/1964    Years since quitting: 58.2   Smokeless tobacco: Never  Vaping Use   Vaping Use: Never used  Substance and Sexual Activity   Alcohol use: No   Drug use: No   Sexual activity: Not Currently  Other Topics Concern   Not on file  Social History Narrative   Not on file   Social Determinants of Health   Financial Resource Strain: Not on file  Food Insecurity: Not on file  Transportation Needs: Not on file  Physical Activity: Not on file  Stress: Not on file  Social Connections: Not on file  Intimate Partner Violence: Not on file    History reviewed. No pertinent family history.   Review of Systems  Constitutional: Negative.  Negative for chills and fever.  HENT: Negative.  Negative for congestion and sore throat.   Respiratory: Negative.  Negative for cough and shortness of breath.   Cardiovascular: Negative.  Negative for chest pain and palpitations.  Gastrointestinal:  Negative for abdominal pain, diarrhea, nausea and vomiting.  Genitourinary:  Positive for hematuria.  Skin: Negative.  Negative for rash.  Neurological: Negative.  Negative for dizziness and headaches.  All other systems reviewed and are negative.  Today's Vitals   08/27/22 1319  BP: 122/78  Pulse: 86  Temp: 98 F (36.7 C)  TempSrc: Oral  SpO2: 95%  Weight: 166 lb 9.6 oz (75.6 kg)  Height: '5\' 4"'$  (1.626 m)   Body mass index is 28.6 kg/m.   Physical Exam Vitals reviewed.  Constitutional:      Appearance: Normal appearance.  HENT:     Head: Normocephalic.  Eyes:     Extraocular Movements: Extraocular movements intact.     Pupils: Pupils are equal, round, and reactive to light.   Cardiovascular:     Rate and Rhythm: Normal rate and regular rhythm.     Heart sounds: Normal heart sounds.  Pulmonary:     Effort: Pulmonary effort is normal.     Breath sounds: Normal breath sounds.  Abdominal:     Palpations: Abdomen is soft.     Tenderness: There is no abdominal tenderness.  Skin:    General: Skin is warm and dry.  Neurological:     Mental Status: He is alert and oriented to person, place, and time.  Psychiatric:        Mood and Affect: Mood normal.        Behavior: Behavior normal.      ASSESSMENT & PLAN: A total of 44 minutes was spent with the patient and counseling/coordination of care regarding preparing for this visit, review of most recent office visit notes, review of most recent urologist procedure note, review of most recent blood work results, review of chronic medical conditions under management, review of all medications, education on nutrition, prognosis, documentation, and need for follow-up  Problem List Items Addressed This Visit       Endocrine   Hypothyroidism    Clinically euthyroid. Continue levothyroxine 75 mcg daily.        Genitourinary   Benign prostatic hyperplasia without lower urinary tract symptoms    Continue tamsulosin 0.4 mg Recently started on finasteride 5 mg daily      Relevant Medications   finasteride (PROSCAR) 5 MG tablet   Malignant neoplasm of overlapping sites of bladder ([place]) - Primary    Stable.  Occasional hematuria continues Scheduled for surgery next week.        Other   Dyslipidemia    Stable.  Diet and nutrition discussed.  Continue atorvastatin 10 mg daily.      Patient Instructions  Mantenimiento de la salud despus de los 64 aos de edad Health Maintenance After Age 20 Despus de los 65 aos de edad, corre un riesgo mayor de Tourist information centre manager enfermedades e infecciones a Barrister's clerk, como tambin de sufrir lesiones por cadas. Las cadas son la causa principal de las fracturas de huesos y  lesiones en la cabeza de personas mayores de 9 aos de edad. Recibir cuidados preventivos de forma regular puede ayudarlo a mantenerse saludable y en buen [place]. Los cuidados preventivos incluyen realizarse anlisis de forma regular y Actor en el estilo de vida segn las recomendaciones del mdico. Converse con el mdico sobre lo siguiente: Las pruebas de deteccin y los anlisis que debe Dispensing optician. Una prueba de deteccin es un estudio que se para Hydrographic surveyor la presencia de una enfermedad cuando no tiene sntomas. Un plan de dieta y ejercicios adecuado para usted. Qu debo saber sobre las pruebas de deteccin y los anlisis para prevenir cadas? Realizarse pruebas de deteccin y anlisis es la mejor [place]  de detectar un problema de salud de forma temprana. El diagnstico y tratamiento tempranos le brindan la mejor oportunidad de Chief Technology Officer las afecciones mdicas que son comunes despus de los 16 aos de edad. Ciertas afecciones y elecciones de estilo de vida pueden hacer que sea ms propenso a sufrir Engineer, manufacturing. El mdico puede recomendarle lo siguiente: Controles regulares de la visin. Una visin deficiente y afecciones como las cataratas pueden hacer que sea ms propenso a sufrir Engineer, manufacturing. Si [place] lentes, asegrese de obtener una receta actualizada si su visin cambia. Revisin de medicamentos. Revise regularmente con el mdico todos los medicamentos que toma, incluidos los medicamentos de [place]. Consulte al [employer] efectos secundarios que pueden hacer que sea ms propenso a sufrir Engineer, manufacturing. Informe al mdico si alguno de los medicamentos que toma lo hace sentir mareado o somnoliento. Controles de fuerza y equilibrio. El mdico puede recomendar ciertos estudios para controlar su fuerza y equilibrio al estar de pie, al caminar o al cambiar de posicin. Examen de los pies. El dolor y Chiropractor en los pies, como tambin no utilizar el calzado [place], pueden hacer que  sea ms propenso a sufrir Engineer, manufacturing. Pruebas de deteccin, que incluyen las siguientes: Pruebas de deteccin para la osteoporosis. La osteoporosis es una afeccin que hace que los huesos se tornen ms dbiles y se quiebren con ms facilidad. Pruebas de deteccin para la presin arterial. Los cambios en la presin arterial y los medicamentos para Chief Technology Officer la presin arterial pueden hacerlo sentir mareado. Prueba de deteccin de la depresin. Es ms probable que sufra una cada si tiene miedo a caerse, se siente deprimido o se siente incapaz de Patent examiner. Prueba de deteccin de consumo de alcohol. Beber demasiado alcohol puede afectar su equilibrio y puede hacer que sea ms propenso a sufrir Engineer, manufacturing. Siga estas indicaciones en su casa: Estilo de vida No beba alcohol si: Su mdico le indica no hacerlo. Si bebe alcohol: Limite la cantidad que bebe a lo siguiente: De 0 a 1 medida por da para las mujeres. De 0 a 2 medidas por da para los hombres. Sepa cunta cantidad de alcohol hay en las bebidas que toma. En los [place], una medida equivale a una botella de cerveza de 12 oz (355 ml), un vaso de vino de 5 oz (148 ml) o un vaso de una bebida alcohlica de alta graduacin de 1 oz (44 ml). No consuma ningn producto que contenga nicotina o tabaco. Estos productos incluyen cigarrillos, tabaco para Higher education careers adviser y aparatos de vapeo, como los Psychologist, sport and exercise. Si necesita ayuda para dejar de consumir estos productos, consulte al [employer]. Actividad  Siga un programa de ejercicio regular para mantenerse en forma. Esto lo ayudar a Contractor equilibrio. Consulte al mdico qu tipos de ejercicios son adecuados para usted. Si necesita un bastn o un andador, selo segn las recomendaciones del mdico. Utilice calzado con buen apoyo y suela antideslizante. Seguridad  Retire los [place] puedan causar tropiezos tales como alfombras, cables u obstculos. Instale  equipos de seguridad, como barras para sostn en los baos y barandas de seguridad en las escaleras. [place] habitaciones y los pasillos bien iluminados. Indicaciones generales Hable con el mdico sobre sus riesgos de sufrir una cada. Infrmele a su mdico si: Se cae. Asegrese de informarle a su mdico acerca de todas las cadas, incluso aquellas que parecen ser [employer] Se siente mareado, cansado (tiene fatiga) o siente que pierde el equilibrio. Use  los medicamentos de venta libre y los recetados solamente como se lo haya indicado el mdico. Estos incluyen suplementos. Siga una dieta sana y [place] un peso saludable. Una dieta saludable incluye productos lcteos descremados, carnes bajas en contenido de grasa ([place]), fibra de granos enteros, frijoles y [place] frutas y verduras. [place]. Realcese los estudios de rutina de la salud, dentales y de Public librarian. Resumen Tener un estilo de vida saludable y recibir cuidados preventivos pueden ayudar a Theatre stage manager salud y el bienestar despus de los 65 aos de [place]. Realizarse pruebas de deteccin y [employer] es la mejor manera de Hydrographic surveyor un problema de salud de forma temprana y Lourena Simmonds a Product/process development scientist una cada. El diagnstico y tratamiento tempranos le brindan la mejor oportunidad de Chief Technology Officer las afecciones mdicas ms comunes en las personas mayores de 79 aos de edad. Las cadas son la causa principal de las fracturas de huesos y lesiones en la cabeza de personas mayores de 65 aos de edad. Tome precauciones para evitar una cada en su casa. Trabaje con el mdico para saber qu cambios que puede hacer para mejorar su salud y [place], y [place]. Esta informacin no tiene Marine scientist el consejo del mdico. Asegrese de hacerle al mdico cualquier pregunta que tenga. Document Revised: 01/22/2021 Document Reviewed: 01/22/2021 Elsevier Patient Education  [place],  MD [facility]

## 2022-08-27 NOTE — Assessment & Plan Note (Signed)
Clinically euthyroid. Continue levothyroxine 75 mcg daily.

## 2022-08-27 NOTE — Patient Instructions (Signed)
Mantenimiento de la salud despus de los 65 aos de edad Health Maintenance After Age 83 Despus de los 65 aos de edad, corre un riesgo mayor de padecer ciertas enfermedades e infecciones a largo plazo, como tambin de sufrir lesiones por cadas. Las cadas son la causa principal de las fracturas de huesos y lesiones en la cabeza de personas mayores de 65 aos de edad. Recibir cuidados preventivos de forma regular puede ayudarlo a mantenerse saludable y en buen estado. Los cuidados preventivos incluyen realizarse anlisis de forma regular y realizar cambios en el estilo de vida segn las recomendaciones del mdico. Converse con el mdico sobre lo siguiente: Las pruebas de deteccin y los anlisis que debe realizarse. Una prueba de deteccin es un estudio que se para detectar la presencia de una enfermedad cuando no tiene sntomas. Un plan de dieta y ejercicios adecuado para usted. Qu debo saber sobre las pruebas de deteccin y los anlisis para prevenir cadas? Realizarse pruebas de deteccin y anlisis es la mejor manera de detectar un problema de salud de forma temprana. El diagnstico y tratamiento tempranos le brindan la mejor oportunidad de controlar las afecciones mdicas que son comunes despus de los 65 aos de edad. Ciertas afecciones y elecciones de estilo de vida pueden hacer que sea ms propenso a sufrir una cada. El mdico puede recomendarle lo siguiente: Controles regulares de la visin. Una visin deficiente y afecciones como las cataratas pueden hacer que sea ms propenso a sufrir una cada. Si usa lentes, asegrese de obtener una receta actualizada si su visin cambia. Revisin de medicamentos. Revise regularmente con el mdico todos los medicamentos que toma, incluidos los medicamentos de venta libre. Consulte al mdico sobre los efectos secundarios que pueden hacer que sea ms propenso a sufrir una cada. Informe al mdico si alguno de los medicamentos que toma lo hace sentir mareado o  somnoliento. Controles de fuerza y equilibrio. El mdico puede recomendar ciertos estudios para controlar su fuerza y equilibrio al estar de pie, al caminar o al cambiar de posicin. Examen de los pies. El dolor y el adormecimiento en los pies, como tambin no utilizar el calzado adecuado, pueden hacer que sea ms propenso a sufrir una cada. Pruebas de deteccin, que incluyen las siguientes: Pruebas de deteccin para la osteoporosis. La osteoporosis es una afeccin que hace que los huesos se tornen ms dbiles y se quiebren con ms facilidad. Pruebas de deteccin para la presin arterial. Los cambios en la presin arterial y los medicamentos para controlar la presin arterial pueden hacerlo sentir mareado. Prueba de deteccin de la depresin. Es ms probable que sufra una cada si tiene miedo a caerse, se siente deprimido o se siente incapaz de realizar actividades que sola hacer. Prueba de deteccin de consumo de alcohol. Beber demasiado alcohol puede afectar su equilibrio y puede hacer que sea ms propenso a sufrir una cada. Siga estas indicaciones en su casa: Estilo de vida No beba alcohol si: Su mdico le indica no hacerlo. Si bebe alcohol: Limite la cantidad que bebe a lo siguiente: De 0 a 1 medida por da para las mujeres. De 0 a 2 medidas por da para los hombres. Sepa cunta cantidad de alcohol hay en las bebidas que toma. En [place], una medida equivale a una botella de cerveza de 12 oz (355 ml), un vaso de vino de 5 oz (148 ml) o un vaso de una bebida alcohlica de alta graduacin de 1 oz (44 ml). No consuma ningn producto que   contenga nicotina o tabaco. Estos productos incluyen cigarrillos, tabaco para mascar y aparatos de vapeo, como los cigarrillos electrnicos. Si necesita ayuda para dejar de consumir estos productos, consulte al mdico. Actividad  Siga un programa de ejercicio regular para mantenerse en forma. Esto lo ayudar a mantener el equilibrio. Consulte al  mdico qu tipos de ejercicios son adecuados para usted. Si necesita un bastn o un andador, selo segn las recomendaciones del mdico. Utilice calzado con buen apoyo y suela antideslizante. Seguridad  Retire los objetos que puedan causar tropiezos tales como alfombras, cables u obstculos. Instale equipos de seguridad, como barras para sostn en los baos y barandas de seguridad en las escaleras. Mantenga las habitaciones y los pasillos bien iluminados. Indicaciones generales Hable con el mdico sobre sus riesgos de sufrir una cada. Infrmele a su mdico si: Se cae. Asegrese de informarle a su mdico acerca de todas las cadas, incluso aquellas que parecen ser menores. Se siente mareado, cansado (tiene fatiga) o siente que pierde el equilibrio. Use los medicamentos de venta libre y los recetados solamente como se lo haya indicado el mdico. Estos incluyen suplementos. Siga una dieta sana y mantenga un peso saludable. Una dieta saludable incluye productos lcteos descremados, carnes bajas en contenido de grasa (magras), fibra de granos enteros, frijoles y muchas frutas y verduras. Mantngase al da con las vacunas. Realcese los estudios de rutina de la salud, dentales y de la vista. Resumen Tener un estilo de vida saludable y recibir cuidados preventivos pueden ayudar a promover la salud y el bienestar despus de los 65 aos de edad. Realizarse pruebas de deteccin y anlisis es la mejor manera de detectar un problema de salud de forma temprana y ayudarlo a evitar una cada. El diagnstico y tratamiento tempranos le brindan la mejor oportunidad de controlar las afecciones mdicas ms comunes en las personas mayores de 65 aos de edad. Las cadas son la causa principal de las fracturas de huesos y lesiones en la cabeza de personas mayores de 65 aos de edad. Tome precauciones para evitar una cada en su casa. Trabaje con el mdico para saber qu cambios que puede hacer para mejorar su salud y  bienestar, y para prevenir las cadas. Esta informacin no tiene como fin reemplazar el consejo del mdico. Asegrese de hacerle al mdico cualquier pregunta que tenga. Document Revised: 01/22/2021 Document Reviewed: 01/22/2021 Elsevier Patient Education  2023 [employer]  

## 2022-08-28 ENCOUNTER — Encounter (HOSPITAL_BASED_OUTPATIENT_CLINIC_OR_DEPARTMENT_OTHER): Payer: Self-pay | Admitting: Urology

## 2022-08-28 ENCOUNTER — Other Ambulatory Visit: Payer: Self-pay

## 2022-08-28 NOTE — Progress Notes (Signed)
Spoke w/ via phone for pre-op interview---patient, son and interpreter Truddie Coco) Lab needs dos----none               Lab results------09/16/2016 Echo in [place] test -----patient states asymptomatic no test needed Arrive at -------0945 on Tuesday, September 01, 2022 NPO after MN NO Solid Food.  Clear liquids from MN until--0845- Med rec completed Medications to take morning of surgery -----Lipitor, Proscar, Levothyroxine Diabetic medication -----n/a Patient instructed no nail polish to be worn day of surgery Patient instructed to bring photo id and insurance card day of surgery Patient aware to have Driver (ride ) / caregiver    for 24 hours after surgery - son and interpreter, Truddie Coco Patient Special Instructions -----none Pre-Op special Istructions -----Patient is bringing his son and friend, Truddie Coco to interpret for patient on day of surgery. Patient verbalized understanding of instructions that were given at this phone interview. Patient denies shortness of breath, chest pain, fever, cough at this phone interview.

## 2022-09-01 ENCOUNTER — Other Ambulatory Visit: Payer: Self-pay

## 2022-09-01 ENCOUNTER — Ambulatory Visit (HOSPITAL_BASED_OUTPATIENT_CLINIC_OR_DEPARTMENT_OTHER): Payer: Medicare HMO | Admitting: Anesthesiology

## 2022-09-01 ENCOUNTER — Encounter (HOSPITAL_COMMUNITY)
Admission: RE | Disposition: A | Discharge status: Home / Self Care | Payer: Self-pay | Source: Home / Self Care | Attending: Urology

## 2022-09-01 ENCOUNTER — Inpatient Hospital Stay (HOSPITAL_BASED_OUTPATIENT_CLINIC_OR_DEPARTMENT_OTHER)
Admission: RE | Admit: 2022-09-01 | Discharge: 2022-09-04 | DRG: 667 | Disposition: A | Discharge status: Home / Self Care | Payer: Medicare HMO | Attending: Urology | Admitting: Urology

## 2022-09-01 ENCOUNTER — Encounter (HOSPITAL_BASED_OUTPATIENT_CLINIC_OR_DEPARTMENT_OTHER): Payer: Self-pay | Admitting: Urology

## 2022-09-01 DIAGNOSIS — N400 Enlarged prostate without lower urinary tract symptoms: Secondary | ICD-10-CM | POA: Diagnosis present

## 2022-09-01 DIAGNOSIS — Z8616 Personal history of COVID-19: Secondary | ICD-10-CM

## 2022-09-01 DIAGNOSIS — C679 Malignant neoplasm of bladder, unspecified: Secondary | ICD-10-CM | POA: Diagnosis not present

## 2022-09-01 DIAGNOSIS — E785 Hyperlipidemia, unspecified: Secondary | ICD-10-CM | POA: Diagnosis present

## 2022-09-01 DIAGNOSIS — E039 Hypothyroidism, unspecified: Secondary | ICD-10-CM | POA: Diagnosis present

## 2022-09-01 DIAGNOSIS — J45909 Unspecified asthma, uncomplicated: Secondary | ICD-10-CM | POA: Diagnosis present

## 2022-09-01 DIAGNOSIS — Z87891 Personal history of nicotine dependence: Secondary | ICD-10-CM

## 2022-09-01 DIAGNOSIS — Z79890 Hormone replacement therapy (postmenopausal): Secondary | ICD-10-CM

## 2022-09-01 DIAGNOSIS — R7303 Prediabetes: Secondary | ICD-10-CM | POA: Diagnosis present

## 2022-09-01 DIAGNOSIS — Z79899 Other long term (current) drug therapy: Secondary | ICD-10-CM

## 2022-09-01 DIAGNOSIS — C678 Malignant neoplasm of overlapping sites of bladder: Secondary | ICD-10-CM | POA: Diagnosis not present

## 2022-09-01 DIAGNOSIS — R310 Gross hematuria: Secondary | ICD-10-CM | POA: Diagnosis present

## 2022-09-01 DIAGNOSIS — Z01818 Encounter for other preprocedural examination: Principal | ICD-10-CM

## 2022-09-01 HISTORY — DX: Benign prostatic hyperplasia without lower urinary tract symptoms: N40.0

## 2022-09-01 HISTORY — DX: Presence of spectacles and contact lenses: Z97.3

## 2022-09-01 HISTORY — DX: COVID-19: U07.1

## 2022-09-01 HISTORY — PX: TRANSURETHRAL RESECTION OF BLADDER TUMOR: SHX2575

## 2022-09-01 HISTORY — PX: TRANSURETHRAL RESECTION OF PROSTATE: SHX73

## 2022-09-01 HISTORY — DX: Prediabetes: R73.03

## 2022-09-01 HISTORY — DX: Hypothyroidism, unspecified: E03.9

## 2022-09-01 HISTORY — DX: Hyperlipidemia, unspecified: E78.5

## 2022-09-01 SURGERY — TURBT (TRANSURETHRAL RESECTION OF BLADDER TUMOR)
Anesthesia: General | Site: Prostate

## 2022-09-01 MED ORDER — LACTATED RINGERS IV SOLN: INTRAVENOUS | Status: DC

## 2022-09-01 MED ORDER — PROPOFOL 500 MG/50ML IV EMUL
INTRAVENOUS | Status: AC
  Filled 2022-09-01: qty 50

## 2022-09-01 MED ORDER — PROPOFOL 10 MG/ML IV BOLUS
INTRAVENOUS | Status: AC
  Filled 2022-09-01: qty 20

## 2022-09-01 MED ORDER — TAMSULOSIN HCL 0.4 MG PO CAPS
0.4000 mg | ORAL_CAPSULE | Freq: Every day | ORAL | Status: DC
  Administered 2022-09-01 – 2022-09-03 (×3): 0.4 mg via ORAL
  Filled 2022-09-01 (×2): qty 1

## 2022-09-01 MED ORDER — OXYCODONE-ACETAMINOPHEN 5-325 MG PO TABS
ORAL_TABLET | ORAL | Status: AC
  Filled 2022-09-01: qty 1

## 2022-09-01 MED ORDER — OXYCODONE-ACETAMINOPHEN 5-325 MG PO TABS
1.0000 | ORAL_TABLET | ORAL | Status: DC | PRN
  Administered 2022-09-01 – 2022-09-02 (×2): 1 via ORAL
  Administered 2022-09-02: 2 via ORAL
  Administered 2022-09-02 – 2022-09-03 (×3): 1 via ORAL
  Administered 2022-09-04: 2 via ORAL
  Filled 2022-09-01 (×3): qty 1
  Filled 2022-09-01: qty 2
  Filled 2022-09-01: qty 1

## 2022-09-01 MED ORDER — FENTANYL CITRATE (PF) 100 MCG/2ML IJ SOLN
INTRAMUSCULAR | Status: AC
  Filled 2022-09-01: qty 2

## 2022-09-01 MED ORDER — ONDANSETRON HCL 4 MG/2ML IJ SOLN
INTRAMUSCULAR | Status: DC | PRN
  Administered 2022-09-01: 4 mg via INTRAVENOUS

## 2022-09-01 MED ORDER — OXYCODONE HCL 5 MG/5ML PO SOLN: 5.0000 mg | Freq: Once | ORAL | Status: DC | PRN

## 2022-09-01 MED ORDER — SODIUM CHLORIDE 0.9 % IR SOLN
Status: DC | PRN
  Administered 2022-09-01 (×21): 3000 mL

## 2022-09-01 MED ORDER — DEXAMETHASONE SODIUM PHOSPHATE 10 MG/ML IJ SOLN
INTRAMUSCULAR | Status: AC
  Filled 2022-09-01: qty 1

## 2022-09-01 MED ORDER — LIDOCAINE HCL (PF) 2 % IJ SOLN
INTRAMUSCULAR | Status: AC
  Filled 2022-09-01: qty 5

## 2022-09-01 MED ORDER — FINASTERIDE 5 MG PO TABS
5.0000 mg | ORAL_TABLET | Freq: Every day | ORAL | Status: DC
  Administered 2022-09-02 – 2022-09-04 (×3): 5 mg via ORAL
  Filled 2022-09-01 (×3): qty 1

## 2022-09-01 MED ORDER — POLYVINYL ALCOHOL 1.4 % OP SOLN: 1.0000 [drp] | Freq: Three times a day (TID) | OPHTHALMIC | Status: DC | PRN

## 2022-09-01 MED ORDER — 0.9 % SODIUM CHLORIDE (POUR BTL) OPTIME
TOPICAL | Status: DC | PRN
  Administered 2022-09-01: 500 mL

## 2022-09-01 MED ORDER — FENTANYL CITRATE (PF) 100 MCG/2ML IJ SOLN
25.0000 ug | INTRAMUSCULAR | Status: DC | PRN
  Administered 2022-09-01 (×2): 25 ug via INTRAVENOUS

## 2022-09-01 MED ORDER — OXYBUTYNIN CHLORIDE 5 MG PO TABS
5.0000 mg | ORAL_TABLET | Freq: Three times a day (TID) | ORAL | Status: DC | PRN
  Administered 2022-09-01: 5 mg via ORAL

## 2022-09-01 MED ORDER — ONDANSETRON HCL 4 MG/2ML IJ SOLN: 4.0000 mg | Freq: Once | INTRAMUSCULAR | Status: DC | PRN

## 2022-09-01 MED ORDER — DIPHENHYDRAMINE HCL 50 MG/ML IJ SOLN: 12.5000 mg | Freq: Four times a day (QID) | INTRAMUSCULAR | Status: DC | PRN

## 2022-09-01 MED ORDER — HYDROMORPHONE HCL 1 MG/ML IJ SOLN: 0.5000 mg | INTRAMUSCULAR | Status: DC | PRN

## 2022-09-01 MED ORDER — CEFAZOLIN SODIUM-DEXTROSE 2-4 GM/100ML-% IV SOLN
INTRAVENOUS | Status: AC
  Filled 2022-09-01: qty 100

## 2022-09-01 MED ORDER — TAMSULOSIN HCL 0.4 MG PO CAPS
ORAL_CAPSULE | ORAL | Status: AC
  Filled 2022-09-01: qty 1

## 2022-09-01 MED ORDER — LIDOCAINE 2% (20 MG/ML) 5 ML SYRINGE
INTRAMUSCULAR | Status: DC | PRN
  Administered 2022-09-01: 60 mg via INTRAVENOUS

## 2022-09-01 MED ORDER — GLYCERIN-HYPROMELLOSE-PEG 400 0.2-0.2-1 % OP SOLN: 1.0000 [drp] | Freq: Three times a day (TID) | OPHTHALMIC | Status: DC | PRN

## 2022-09-01 MED ORDER — ATORVASTATIN CALCIUM 10 MG PO TABS
10.0000 mg | ORAL_TABLET | Freq: Every day | ORAL | Status: DC
  Administered 2022-09-02 – 2022-09-04 (×3): 10 mg via ORAL
  Filled 2022-09-01 (×3): qty 1

## 2022-09-01 MED ORDER — OXYBUTYNIN CHLORIDE 5 MG PO TABS
ORAL_TABLET | ORAL | Status: AC
  Filled 2022-09-01: qty 1

## 2022-09-01 MED ORDER — SODIUM CHLORIDE 0.9 % IR SOLN
3000.0000 mL | Status: DC
  Administered 2022-09-01 – 2022-09-04 (×9): 3000 mL

## 2022-09-01 MED ORDER — ROCURONIUM BROMIDE 10 MG/ML (PF) SYRINGE
PREFILLED_SYRINGE | INTRAVENOUS | Status: DC | PRN
  Administered 2022-09-01: 5 mg via INTRAVENOUS
  Administered 2022-09-01: 50 mg via INTRAVENOUS
  Administered 2022-09-01 (×3): 10 mg via INTRAVENOUS
  Administered 2022-09-01: 5 mg via INTRAVENOUS

## 2022-09-01 MED ORDER — ACETAMINOPHEN 325 MG PO TABS: 650.0000 mg | ORAL_TABLET | ORAL | Status: DC | PRN

## 2022-09-01 MED ORDER — FENTANYL CITRATE (PF) 100 MCG/2ML IJ SOLN
INTRAMUSCULAR | Status: DC | PRN
  Administered 2022-09-01 (×4): 25 ug via INTRAVENOUS

## 2022-09-01 MED ORDER — PROPOFOL 500 MG/50ML IV EMUL
INTRAVENOUS | Status: DC | PRN
  Administered 2022-09-01: 100 ug/kg/min via INTRAVENOUS

## 2022-09-01 MED ORDER — PHENYLEPHRINE 80 MCG/ML (10ML) SYRINGE FOR IV PUSH (FOR BLOOD PRESSURE SUPPORT)
PREFILLED_SYRINGE | INTRAVENOUS | Status: DC | PRN
  Administered 2022-09-01: 160 ug via INTRAVENOUS
  Administered 2022-09-01: 80 ug via INTRAVENOUS

## 2022-09-01 MED ORDER — ONDANSETRON HCL 4 MG/2ML IJ SOLN: 4.0000 mg | INTRAMUSCULAR | Status: DC | PRN

## 2022-09-01 MED ORDER — LEVOTHYROXINE SODIUM 75 MCG PO TABS: 75.0000 ug | ORAL_TABLET | Freq: Every day | ORAL | Status: DC

## 2022-09-01 MED ORDER — ONDANSETRON HCL 4 MG/2ML IJ SOLN
INTRAMUSCULAR | Status: AC
  Filled 2022-09-01: qty 2

## 2022-09-01 MED ORDER — SUGAMMADEX SODIUM 200 MG/2ML IV SOLN
INTRAVENOUS | Status: DC | PRN
  Administered 2022-09-01: 200 mg via INTRAVENOUS

## 2022-09-01 MED ORDER — CEFAZOLIN SODIUM-DEXTROSE 2-4 GM/100ML-% IV SOLN
2.0000 g | INTRAVENOUS | Status: AC
  Administered 2022-09-01: 2 g via INTRAVENOUS

## 2022-09-01 MED ORDER — DIPHENHYDRAMINE HCL 12.5 MG/5ML PO ELIX: 12.5000 mg | ORAL_SOLUTION | Freq: Four times a day (QID) | ORAL | Status: DC | PRN

## 2022-09-01 MED ORDER — ACETAMINOPHEN 500 MG PO TABS
ORAL_TABLET | ORAL | Status: AC
  Filled 2022-09-01: qty 2

## 2022-09-01 MED ORDER — ROCURONIUM BROMIDE 10 MG/ML (PF) SYRINGE
PREFILLED_SYRINGE | INTRAVENOUS | Status: AC
  Filled 2022-09-01: qty 10

## 2022-09-01 MED ORDER — DEXAMETHASONE SODIUM PHOSPHATE 10 MG/ML IJ SOLN
INTRAMUSCULAR | Status: DC | PRN
  Administered 2022-09-01: 4 mg via INTRAVENOUS

## 2022-09-01 MED ORDER — OXYCODONE HCL 5 MG PO TABS: 5.0000 mg | ORAL_TABLET | Freq: Once | ORAL | Status: DC | PRN

## 2022-09-01 MED ORDER — STERILE WATER FOR IRRIGATION IR SOLN
Status: DC | PRN
  Administered 2022-09-01 (×2): 3000 mL
  Administered 2022-09-01: 500 mL
  Administered 2022-09-01: 3000 mL

## 2022-09-01 MED ORDER — PROPOFOL 10 MG/ML IV BOLUS
INTRAVENOUS | Status: DC | PRN
  Administered 2022-09-01: 20 mg via INTRAVENOUS
  Administered 2022-09-01: 10 mg via INTRAVENOUS
  Administered 2022-09-01: 60 mg via INTRAVENOUS
  Administered 2022-09-01: 140 mg via INTRAVENOUS

## 2022-09-01 MED ORDER — SODIUM CHLORIDE 0.9 % IV SOLN: INTRAVENOUS | Status: DC

## 2022-09-01 MED ORDER — ACETAMINOPHEN 500 MG PO TABS
1000.0000 mg | ORAL_TABLET | Freq: Once | ORAL | Status: AC
  Administered 2022-09-01: 1000 mg via ORAL

## 2022-09-01 SURGICAL SUPPLY — 35 items
BAG DRAIN URO-CYSTO SKYTR STRL (DRAIN) ×4 IMPLANT
BAG DRN RND TRDRP ANRFLXCHMBR (UROLOGICAL SUPPLIES)
BAG DRN UROCATH (DRAIN) ×3
BAG URINE DRAIN 2000ML AR STRL (UROLOGICAL SUPPLIES) IMPLANT
CATH FOLEY 2WAY SLVR  5CC 20FR (CATHETERS)
CATH FOLEY 2WAY SLVR  5CC 22FR (CATHETERS)
CATH FOLEY 2WAY SLVR 5CC 20FR (CATHETERS) IMPLANT
CATH FOLEY 2WAY SLVR 5CC 22FR (CATHETERS) IMPLANT
CATH HEMA 3WAY 30CC 22FR COUDE (CATHETERS) ×1 IMPLANT
CATH URETL OPEN 5X70 (CATHETERS) ×1 IMPLANT
CLOTH BEACON ORANGE TIMEOUT ST (SAFETY) ×4 IMPLANT
CNTNR URN SCR LID CUP LEK RST (MISCELLANEOUS) ×1 IMPLANT
CONT SPEC 4OZ STRL OR WHT (MISCELLANEOUS) ×3
ELECT REM PT RETURN 9FT ADLT (ELECTROSURGICAL) ×3
ELECTRODE REM PT RTRN 9FT ADLT (ELECTROSURGICAL) ×4 IMPLANT
EVACUATOR MICROVAS BLADDER (UROLOGICAL SUPPLIES) IMPLANT
GLOVE BIO SURGEON STRL SZ7.5 (GLOVE) ×4 IMPLANT
GOWN STRL REUS W/TWL LRG LVL3 (GOWN DISPOSABLE) ×5 IMPLANT
GUIDEWIRE STR DUAL SENSOR (WIRE) ×1 IMPLANT
HOLDER FOLEY CATH W/STRAP (MISCELLANEOUS) IMPLANT
IV NS IRRIG 3000ML ARTHROMATIC (IV SOLUTION) ×24 IMPLANT
KIT TURNOVER CYSTO (KITS) ×4 IMPLANT
LOOP CUT BIPOLAR 24F LRG (ELECTROSURGICAL) ×1 IMPLANT
LOOP MONOPOLAR YLW (ELECTROSURGICAL) IMPLANT
MANIFOLD NEPTUNE II (INSTRUMENTS) ×1 IMPLANT
NS IRRIG 500ML POUR BTL (IV SOLUTION) ×1 IMPLANT
PACK CYSTO (CUSTOM PROCEDURE TRAY) ×4 IMPLANT
PLUG CATH AND CAP STER (CATHETERS) IMPLANT
SYR 20ML LL LF (SYRINGE) ×4 IMPLANT
SYR 30ML LL (SYRINGE) ×1 IMPLANT
SYR TOOMEY IRRIG 70ML (MISCELLANEOUS) ×3
SYRINGE TOOMEY IRRIG 70ML (MISCELLANEOUS) ×1 IMPLANT
TUBE CONNECTING 12X1/4 (SUCTIONS) ×1 IMPLANT
WATER STERILE IRR 3000ML UROMA (IV SOLUTION) ×3 IMPLANT
WATER STERILE IRR 500ML POUR (IV SOLUTION) ×1 IMPLANT

## 2022-09-01 NOTE — Anesthesia Preprocedure Evaluation (Addendum)
Anesthesia Evaluation  Patient identified by MRN, date of birth, ID band Patient awake    Reviewed: Allergy & Precautions, NPO status , Patient's Chart, lab work & pertinent test results  History of Anesthesia Complications Negative for: history of anesthetic complications  Airway Mallampati: II  TM Distance: >3 FB Neck ROM: Full    Dental  (+) Dental Advisory Given, Missing, Chipped, Poor Dentition,    Pulmonary asthma , former smoker   Pulmonary exam normal        Cardiovascular negative cardio ROS Normal cardiovascular exam     Neuro/Psych negative neurological ROS  negative psych ROS   GI/Hepatic negative GI ROS, Neg liver ROS,,,  Endo/Other  Hypothyroidism   Pre-DM   Renal/GU negative Renal ROS     Musculoskeletal negative musculoskeletal ROS (+)    Abdominal   Peds  Hematology negative hematology ROS (+)   Anesthesia Other Findings   Reproductive/Obstetrics                             Anesthesia Physical Anesthesia Plan  ASA: 2  Anesthesia Plan: General   Post-op Pain Management: Tylenol PO (pre-op)*   Induction: Intravenous  PONV Risk Score and Plan: 2 and Treatment may vary due to age or medical condition, Ondansetron and Propofol infusion  Airway Management Planned: LMA and Oral ETT  Additional Equipment: None  Intra-op Plan:   Post-operative Plan: Extubation in OR  Informed Consent: I have reviewed the patients History and Physical, chart, labs and discussed the procedure including the risks, benefits and alternatives for the proposed anesthesia with the patient or authorized representative who has indicated his/her understanding and acceptance.     Dental advisory given and Interpreter used for interveiw  Plan Discussed with: CRNA and Anesthesiologist  Anesthesia Plan Comments: (LMA vs. ETT pending surgeon's request for relaxation)       Anesthesia  Quick Evaluation

## 2022-09-01 NOTE — Transfer of Care (Signed)
Immediate Anesthesia Transfer of Care Note  Patient: Joel Adkins  Procedure(s) Performed: TRANSURETHRAL RESECTION OF BLADDER TUMOR (TURBT) (Bladder) TRANSURETHRAL RESECTION OF THE PROSTATE (TURP) (Prostate)  Patient Location: PACU  Anesthesia Type:General  Level of Consciousness: awake, alert , oriented, and patient cooperative  Airway & Oxygen Therapy: Patient Spontanous Breathing  Post-op Assessment: Report given to RN and Post -op Vital signs reviewed and stable  Post vital signs: Reviewed and stable  Last Vitals:  Vitals Value Taken Time  BP 132/66 09/01/22 1454  Temp    Pulse 65 09/01/22 1458  Resp 19 09/01/22 1458  SpO2 97 % 09/01/22 1458  Vitals shown include unvalidated device data.  Last Pain:  Vitals:   09/01/22 1028  TempSrc: Oral  PainSc: 0-No pain      Patients Stated Pain Goal: 7 (02/89/02 2840)  Complications: No notable events documented.

## 2022-09-01 NOTE — Anesthesia Procedure Notes (Signed)
Procedure Name: Intubation Date/Time: 09/01/2022 11:56 AM  Performed by: Rogers Blocker, CRNAPre-anesthesia Checklist: Patient identified, Emergency Drugs available, Suction available and Patient being monitored Patient Re-evaluated:Patient Re-evaluated prior to induction Oxygen Delivery Method: Circle System Utilized Preoxygenation: Pre-oxygenation with 100% oxygen Induction Type: IV induction Ventilation: Mask ventilation without difficulty Laryngoscope Size: Mac and 3 Grade View: Grade I Tube type: Oral Tube size: 7.0 mm Number of attempts: 1 Airway Equipment and Method: Stylet and Bite block Placement Confirmation: ETT inserted through vocal cords under direct vision, positive ETCO2 and breath sounds checked- equal and bilateral Secured at: 22 cm Tube secured with: Tape Dental Injury: Teeth and Oropharynx as per pre-operative assessment

## 2022-09-01 NOTE — Progress Notes (Signed)
Small clot in bag and was not able to empty so bag was changed.

## 2022-09-01 NOTE — Interval H&P Note (Signed)
History and Physical Interval Note:  09/01/2022 11:26 AM  Joel Adkins  has presented today for surgery, with the diagnosis of BLADDER CANCER.  The various methods of treatment have been discussed with the patient and family. After consideration of risks, benefits and other options for treatment, the patient has consented to  Procedure(s) with comments: TRANSURETHRAL RESECTION OF BLADDER TUMOR (TURBT) (N/A) - 1 HR CYSTOSCOPY WITH RETROGRADE PYELOGRAM (Bilateral) as a surgical intervention.  The patient's history has been reviewed, patient examined, no change in status, stable for surgery.  I have reviewed the patient's chart and labs.  Questions were answered to the patient's satisfaction.     Remi Haggard

## 2022-09-01 NOTE — Op Note (Signed)
Preoperative diagnosis:  1.  Bladder cancer  Postoperative diagnosis: 1.  Bladder cancer  Procedure(s): 1.  Cystoscopy, limited transurethral section of the prostate, transurethral section of bladder tumor (large)  Surgeon: Dr. Harold Barban  Anesthesia: General  Complications: None  EBL: 100 cc  Specimens: Prostate chips, bladder tumor  Disposition of specimens: To pathology  Intraoperative findings: Marked trilobar prostatic hypertrophy, required resection of median lobe to advance scope for visualization of bladder tumor.  Large approximate 4 to 5 cm necrotic bladder tumor emanating from right posterior lateral wall of the bladder.  Tumor cannot be visualized without significant compression of the bladder suprapubic area.  Larger area of tumor was resected down to the muscle wall.  Remainderof bladder tumor was not reachable for complete resection and had some oozing from tumor.  Tumor was cauterized but incompletely resected.  Distal urethra required dilation with Leander Rams sounds to allow passage of the scope.  38 [place] hematuria catheter placed to CBI irrigation with pink urine noted at termination of the case.  Indication: 82 year old Hispanic male presented with gross painless hematuria.  Has CT scan showing large exophytic filling defect within the urinary bladder.  Cystoscopy confirmed large exophytic tumor and several satellite tumors.  At this time and get cystoscopy attempt at TURBT.  Description of procedure:  After obtaining informed consent the patient was taken the major cystoscopy suite placed under general anesthesia.  Placed in dorsolithotomy position genitalia prepped and draped in usual sterile fashion.  Proper pause and timeout was performed.  I attempted to pass the 68 [place] however due to the meatus and distal urethra being markedly narrowed I had to dilate this with Leander Rams sounds to allow admission of the cystoscope.  Subsequent is able to advance this  into the bladder.  He had marked trilobar prostatic hypertrophy and passage of the scope caused significant bleeding just in the median lobe.  The tumor could be visualized but it was very poor but visualization additionally because of the bleeding from the median lobe.  Ureteral orifice ease were identified however did not feel retrograde could be feasible due to the amount of bleeding.  Cystoscope was removed and the 26 French continuous-flow saline resectoscope was placed initially utilizing the obturator Urinary in the distal urethra and advanced under direct vision utilizing the direct visualized obturator.  This was advanced into the bladder.  I attempted to see the tumor but due to the angle sedation with the median lobe was not really feasible to do any resection.  Felt the median lobe was taken down to allow admission of the scope for better visualization of the tumor.  The resectoscope was utilized to resect the median lobe down to the level of the bladder neck proper.  Hemostasis was obtained with cautery loop.  The chips of the prostate were then evacuated from the bladder.  I was then able to advance the resectoscope into the bladder still had difficult time visualizing the tumor due to its high right-sided posterior lateral bladder location.  I utilized external compression with my opposite hand was able to visualize the tumor but required significant compression.  Utilizing this technique and emptying the bladder periodically I was able to resect approximately half of the tumor into the muscularis.  The base was cauterized.  Portion of the tumor was so high up in the bladder even with compression I was unable to get that loop passed the superior portion of the tumor.  The tumor was necrotic and  very nodular and based on resection of the other tumor appeared to be muscle invasive.  Did not feel comfortable continuing with resection at this location due to the high risk of bleeding and not being able  to reach the area to cauterize it.  Subsequently cauterized externally and there was some oozing from the frondular tumor however was not significant because clot formation.  I irrigated the tumor from the bladder for pathologic specimen.  The area of resection was reinspected with good hemostasis.  The area of bleeding significant coming from the remaining tumor mass which I did not feel was resectable.  Cystoscope was removed and 70 [place] three-way hematuria catheter was placed to normal saline CBI irrigation.  Effluent was pink to moderate rate CBI.  There are irrigated easily with no clots.  Based on visualization patient will likely need cystectomy. The procedure was terminated he was awakened from anesthesia taken back to the recovery room in stable condition.  No immediate complication from the procedure.

## 2022-09-02 ENCOUNTER — Encounter (HOSPITAL_BASED_OUTPATIENT_CLINIC_OR_DEPARTMENT_OTHER): Payer: Self-pay | Admitting: Urology

## 2022-09-02 DIAGNOSIS — R7303 Prediabetes: Secondary | ICD-10-CM | POA: Diagnosis not present

## 2022-09-02 DIAGNOSIS — C679 Malignant neoplasm of bladder, unspecified: Secondary | ICD-10-CM | POA: Diagnosis not present

## 2022-09-02 DIAGNOSIS — E785 Hyperlipidemia, unspecified: Secondary | ICD-10-CM | POA: Diagnosis not present

## 2022-09-02 DIAGNOSIS — Z87891 Personal history of nicotine dependence: Secondary | ICD-10-CM | POA: Diagnosis not present

## 2022-09-02 DIAGNOSIS — Z8616 Personal history of COVID-19: Secondary | ICD-10-CM | POA: Diagnosis not present

## 2022-09-02 DIAGNOSIS — E039 Hypothyroidism, unspecified: Secondary | ICD-10-CM | POA: Diagnosis not present

## 2022-09-02 DIAGNOSIS — J45909 Unspecified asthma, uncomplicated: Secondary | ICD-10-CM | POA: Diagnosis not present

## 2022-09-02 DIAGNOSIS — N400 Enlarged prostate without lower urinary tract symptoms: Secondary | ICD-10-CM | POA: Diagnosis not present

## 2022-09-02 DIAGNOSIS — R310 Gross hematuria: Secondary | ICD-10-CM | POA: Diagnosis not present

## 2022-09-02 DIAGNOSIS — Z79890 Hormone replacement therapy (postmenopausal): Secondary | ICD-10-CM | POA: Diagnosis not present

## 2022-09-02 DIAGNOSIS — Z79899 Other long term (current) drug therapy: Secondary | ICD-10-CM | POA: Diagnosis not present

## 2022-09-02 LAB — CBC
HCT: 39.2 % (ref 39.0–52.0)
Hemoglobin: 12.8 g/dL — ABNORMAL LOW (ref 13.0–17.0)
MCH: 27.7 pg (ref 26.0–34.0)
MCHC: 32.7 g/dL (ref 30.0–36.0)
MCV: 84.8 fL (ref 80.0–100.0)
Platelets: 187 10*3/uL (ref 150–400)
RBC: 4.62 MIL/uL (ref 4.22–5.81)
RDW: 15.4 % (ref 11.5–15.5)
WBC: 8.2 10*3/uL (ref 4.0–10.5)
nRBC: 0 % (ref 0.0–0.2)

## 2022-09-02 MED ORDER — OXYCODONE-ACETAMINOPHEN 5-325 MG PO TABS
ORAL_TABLET | ORAL | Status: AC
  Filled 2022-09-02: qty 2

## 2022-09-02 NOTE — Progress Notes (Signed)
Foley was not flowing at 1500 when patient arrived to room 1424. This RN manually irrigated foley and 4 clots retrieved. Foley flowing appropriately at this time. CBI continued as ordered. Pt denied pain.

## 2022-09-02 NOTE — Anesthesia Postprocedure Evaluation (Signed)
Anesthesia Post Note  Patient: Joel Adkins  Procedure(s) Performed: TRANSURETHRAL RESECTION OF BLADDER TUMOR (TURBT) (Bladder) TRANSURETHRAL RESECTION OF THE PROSTATE (TURP) (Prostate)     Patient location during evaluation: PACU Anesthesia Type: General Level of consciousness: awake and alert Pain management: pain level controlled Vital Signs Assessment: post-procedure vital signs reviewed and stable Respiratory status: spontaneous breathing, nonlabored ventilation and respiratory function stable Cardiovascular status: stable and blood pressure returned to baseline Anesthetic complications: no   No notable events documented.  Last Vitals:  Vitals:   09/02/22 1030 09/02/22 1315  BP: 125/68 111/61  Pulse: 77 80  Resp: 14 15  Temp: 36.7 C 36.8 C  SpO2: 94% 93%    Last Pain:  Vitals:   09/02/22 1315  TempSrc: Oral  PainSc: 0-No pain                 Audry Pili

## 2022-09-02 NOTE — Progress Notes (Signed)
1 Day Post-Op Subjective: Patient feeling well status post cystoscopy TURBT.  Remains on slow rate CBI with urine being pink-tinged.  Clot evacuated with manual irrigation last night but has been flowing freely since that time.  I saw the patient earlier this morning and clamped off the CBI irrigation urine has now become dark bloody with a small clot in the tubing.  I flushed this and resumed CBI.  He will need admission to [facility] for continued CBI.  Objective: Vital signs in last 24 hours: Temp:  [97.5 F (36.4 C)-98.2 F (36.8 C)] 97.9 F (36.6 C) (01/03 0826) Pulse Rate:  [66-93] 77 (01/03 0826) Resp:  [11-16] 12 (01/03 0826) BP: (109-146)/(63-98) 120/63 (01/03 0826) SpO2:  [93 %-97 %] 94 % (01/03 0826) Weight:  [71.3 kg] 71.3 kg (01/02 1028)  Intake/Output from previous day: 01/02 0701 - 01/03 0700 In: 13373.5 [P.O.:836; I.V.:2262.5; IV Piggyback:100] Out: 20130 [Urine:20125; Blood:5] Intake/Output this shift: Total I/O In: 1900 [P.O.:100; Other:1800] Out: 950 [Urine:950]  Physical Exam:  General: Alert and oriented CV: RRR Lungs: Clear Abdomen: Soft, ND   Lab Results: No results for input(s): "HGB", "HCT" in the last 72 hours. BMET No results for input(s): "NA", "K", "CL", "CO2", "GLUCOSE", "BUN", "CREATININE", "CALCIUM" in the last 72 hours.   Studies/Results: No results found.  Assessment/Plan: 1.  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  Continued hematuria on low rate CBI Plan/recommendation.  Will admit to [facility] main hospital continue CBI hopefully be able to wean overnight.  Await pathology from TURBT.    LOS: 0 days   Remi Haggard 09/02/2022, 10:13 AM

## 2022-09-03 LAB — SURGICAL PATHOLOGY

## 2022-09-03 NOTE — Progress Notes (Signed)
2 Days Post-Op Subjective: Patient feeling well, urine required CBI overnight.  Remains pink.  I turned off CBI earlier this morning required nurses to restart with passage of couple small clots since that time has been light pink on slow drip CBI.  Hemoglobin 12.8 last night.  Pathology still pending  Objective: Vital signs in last 24 hours: Temp:  [98.2 F (36.8 C)-99.4 F (37.4 C)] 99.4 F (37.4 C) (01/04 0931) Pulse Rate:  [80-98] 98 (01/04 0931) Resp:  [15-20] 18 (01/04 0931) BP: (111-145)/(61-69) 145/69 (01/04 0931) SpO2:  [91 %-96 %] 91 % (01/04 0931)  Intake/Output from previous day: 01/03 0701 - 01/04 0700 In: 22920 [P.O.:2020] Out: 26600 [Urine:26600] Intake/Output this shift: Total I/O In: 8480 [P.O.:480; Other:8000] Out: 5400 [Urine:5400]  Physical Exam:  General: Alert and oriented Abdomen: Soft, ND   Lab Results: Recent Labs    09/02/22 1816  HGB 12.8*  HCT 39.2   BMET No results for input(s): "NA", "K", "CL", "CO2", "GLUCOSE", "BUN", "CREATININE", "CALCIUM" in the last 72 hours.   Studies/Results: No results found.  Assessment/Plan: 1.  Gross hematuria with associated incompletely resected likely high-grade bladder cancer. Plan/recommendation: Continue CBI wean hopefully overnight will likely need to stay today due to continued hematuria.  Await final pathology regarding ultimate disposition regarding management of his bladder cancer.    LOS: 1 day   Remi Haggard 09/03/2022, 12:19 PM

## 2022-09-03 NOTE — TOC Initial Note (Signed)
Transition of Care [facility]) - Initial/Assessment Note    Patient Details  Name: Joel Adkins MRN: [id number] Date of Birth: 1940-12-07  Transition of Care [facility]) CM/SW Contact:    Leeroy Cha, RN Phone Number: 09/03/2022, 7:58 AM  Clinical Narrative:                  Transition of Care [facility]) Screening Note   Patient Details  Name: Joel Adkins Date of Birth: 10-Mar-1941   [facility]) CM/SW Contact:    Leeroy Cha, RN Phone Number: 09/03/2022, 7:58 AM    Transition of Care Department [facility]) has reviewed patient and no TOC needs have been identified at this time. We will continue to monitor patient advancement through interdisciplinary progression rounds. If new patient transition needs arise, please place a TOC consult.    Expected Discharge Plan: Home/Self Care Barriers to Discharge: Continued Medical Work up   Patient Goals and CMS Choice Patient states their goals for this hospitalization and ongoing recovery are:: speaks spanish          Expected Discharge Plan and Services   Discharge Planning Services: CM Consult   Living arrangements for the past 2 months: Apartment                                      Prior Living Arrangements/Services Living arrangements for the past 2 months: Apartment Lives with:: Spouse Patient language and need for interpreter reviewed:: Yes (patient speaks spanish)                 Activities of Daily Living Home Assistive Devices/Equipment: Eyeglasses, Radio producer (specify quad or straight), Walker (specify type) ADL Screening (condition at time of admission) Patient's cognitive ability adequate to safely complete daily activities?: Yes Is the patient deaf or have difficulty hearing?: No Does the patient have difficulty seeing, even when wearing glasses/contacts?: No Does the patient have difficulty concentrating, remembering, or making decisions?: No Patient able to express need for  assistance with ADLs?: Yes Does the patient have difficulty dressing or bathing?: No Independently performs ADLs?: Yes (appropriate for developmental age) Does the patient have difficulty walking or climbing stairs?: No Weakness of Legs: None Weakness of Arms/Hands: None  Permission Sought/Granted                  Emotional Assessment Appearance:: Appears stated age Attitude/Demeanor/Rapport: Unable to Assess Affect (typically observed): Unable to Assess Orientation: : Oriented to Self, Oriented to Place, Oriented to Situation, Oriented to  Time Alcohol / Substance Use: Never Used Psych Involvement: No (comment)  Admission diagnosis:  Bladder cancer [facility]) [C67.9] Patient Active Problem List   Diagnosis Date Noted   Bladder cancer ([place]) 09/01/2022   Malignant neoplasm of overlapping sites of bladder ([place]) 08/27/2022   Hypothyroidism 05/28/2022   Dyslipidemia 04/06/2019   Benign prostatic hyperplasia without lower urinary tract symptoms 04/06/2019   Elevated blood pressure reading 04/06/2019   PCP:  Horald Pollen, MD Pharmacy:   [id number] [place]Phone: [phone]: [phone]    Social Determinants of Health (SDOH) Social History: SDOH Screenings   Depression (PHQ2-9): Low Risk  (08/27/2022)  Tobacco Use: Medium Risk (09/02/2022)   SDOH Interventions:     Readmission Risk Interventions   No data to display

## 2022-09-04 LAB — CBC
HCT: 40.5 % (ref 39.0–52.0)
Hemoglobin: 13.1 g/dL (ref 13.0–17.0)
MCH: 27.7 pg (ref 26.0–34.0)
MCHC: 32.3 g/dL (ref 30.0–36.0)
MCV: 85.6 fL (ref 80.0–100.0)
Platelets: 166 10*3/uL (ref 150–400)
RBC: 4.73 MIL/uL (ref 4.22–5.81)
RDW: 15.3 % (ref 11.5–15.5)
WBC: 5.6 10*3/uL (ref 4.0–10.5)
nRBC: 0 % (ref 0.0–0.2)

## 2022-09-04 MED ORDER — TRAMADOL HCL 50 MG PO TABS: 50.0000 mg | ORAL_TABLET | Freq: Four times a day (QID) | ORAL | 0 refills | Status: DC | PRN

## 2022-09-04 NOTE — Progress Notes (Signed)
Discharge instructions provided. Pt & family present verbalized understanding of all information. Provided step by step instructions for how to clean penis/catheter & provided catheter/periwipes for pt to take home.

## 2022-09-04 NOTE — Progress Notes (Signed)
3 Days Post-Op Subjective: Feels okay this morning.  Urine very light pink-tinged on no CBI.  No clots.  Surgical pathology returned showing BPH on the TURP resection and high-grade T1 urothelial carcinoma.  Muscularis was present but not involved.  Objective: Vital signs in last 24 hours: Temp:  [98.2 F (36.8 C)-99.4 F (37.4 C)] 98.2 F (36.8 C) (01/05 0409) Pulse Rate:  [89-98] 89 (01/05 0409) Resp:  [18-20] 18 (01/05 0409) BP: (129-145)/(64-70) 132/70 (01/05 0409) SpO2:  [91 %-98 %] 95 % (01/05 0409)  Intake/Output from previous day: 01/04 0701 - 01/05 0700 In: 14720 [P.O.:720] Out: 22900 [Urine:22900] Intake/Output this shift: Total I/O In: -  Out: 1750 [Urine:1750]  Physical Exam:  General: Alert and oriented Abdomen: Soft, ND   Lab Results: Recent Labs    09/02/22 1816 09/04/22 0407  HGB 12.8* 13.1  HCT 39.2 40.5   BMET No results for input(s): "NA", "K", "CL", "CO2", "GLUCOSE", "BUN", "CREATININE", "CALCIUM" in the last 72 hours.   Studies/Results: No results found.  Assessment/Plan: 1.  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  Based on appearance appears unresectable due to extension of the tumor and location would not be cystoscopically resectable.  Discussed findings with patient's brother who translated for the patient.  I think the best option would likely be cystectomy with ileal conduit urinary diversion.  I am going to discuss with Dr. Tresa Moore and try to arrange follow-up in our office next week perhaps for consultation regarding possible cystectomy.  Will continue and discharge with Foley catheter for now.  Irrigation port.    LOS: 2 days   Remi Haggard 09/04/2022, 7:59 AM

## 2022-09-04 NOTE — TOC Transition Note (Signed)
Transition of Care [facility]) - CM/SW Discharge Note   Patient Details  Name: Joel Adkins MRN: [id number] Date of Birth: Aug 27, 1941  Transition of Care [facility]) CM/SW Contact:  Leeroy Cha, RN Phone Number: 09/04/2022, 8:38 AM   Clinical Narrative:    Patient discharged to return home with self care.   Final next level of care: Home/Self Care Barriers to Discharge: Barriers Resolved   Patient Goals and CMS Choice      Discharge Placement                         Discharge Plan and Services Additional resources added to the After Visit Summary for     Discharge Planning Services: CM Consult                                 Social Determinants of Health (SDOH) Interventions SDOH Screenings   Depression (PHQ2-9): Low Risk  (08/27/2022)  Tobacco Use: Medium Risk (09/02/2022)     Readmission Risk Interventions   No data to display

## 2022-09-04 NOTE — Discharge Summary (Signed)
Date of admission: 09/01/2022  Date of discharge: 09/04/2022  Admission diagnosis: Bladder cancer  Discharge diagnosis: Bladder cancer  Secondary diagnoses: Benign prostatic hypertrophy  History and Physical: For full details, please see admission history and physical. Briefly, Joel Adkins is a 82 y.o. year old patient with Hispanic male who who has history of gross hematuria.  Found to have large bladder tumor on surveillance cystoscopy.  Presents at this time undergo cystoscopy and TURBT.Marland Kitchen   Hospital Course: Patient was admitted on 09/01/2022 after undergoing cystoscopy and limited TURP and transurethral section of bladder tumor.  For details procedure please see the typed operative note.  Patient's tumor was incompletely resected due to the size and extent and location of the tumor.  The patient was kept on continuous bladder irrigation overnight and required admission to the hospital for continued CBI.  It was slowly weaned over the ensuing 48 hours and urine was light pink off CBI on date of discharge on 09/04/2022.  He is felt ready for discharge home.  To be discharged home on routine preoperative medications plus tramadol as needed for pain.  He will be discharged home with Foley catheter.  Patient ultimately will likely need cystectomy due to the extent of the tumor with pathology showing high-grade at least T1 urothelial carcinoma.  Patient has our telephone number and knows to call should problems arise relative to his management in the interim.  Laboratory values:  Recent Labs    09/02/22 1816 09/04/22 0407  HGB 12.8* 13.1  HCT 39.2 40.5   No results for input(s): "CREATININE" in the last 72 hours.  Disposition: Home  Discharge instruction: The patient was instructed to be ambulatory but told to refrain from heavy lifting, strenuous activity, or driving.   Discharge medications:  Allergies as of 09/04/2022   No Known Allergies      Medication List     TAKE these  medications    atorvastatin 10 MG tablet Commonly known as: LIPITOR Take 1 tablet (10 mg total) by mouth daily.   finasteride 5 MG tablet Commonly known as: PROSCAR Take 5 mg by mouth daily.   Glycerin-Hypromellose-PEG 400 0.2-0.2-1 % Soln Place 1-2 drops into both eyes 3 (three) times daily as needed (for dry eyes.).   levothyroxine 75 MCG tablet Commonly known as: SYNTHROID Take 1 tablet (75 mcg total) by mouth daily before breakfast.   tamsulosin 0.4 MG Caps capsule Commonly known as: FLOMAX Take 1 capsule (0.4 mg total) by mouth at bedtime.   traMADol 50 MG tablet Commonly known as: Ultram Take 1 tablet (50 mg total) by mouth every 6 (six) hours as needed.        Followup:   Follow-up Information     [facility] Follow up.   Why: [facility] will contact patient for follow-up appointment next week Contact information: [place] [phone]

## 2022-09-04 NOTE — Progress Notes (Signed)
Mobility Specialist - Progress Note   09/04/22 1331  Mobility  Activity Ambulated with assistance in hallway  Level of Assistance Modified independent, requires aide device or extra time  Assistive Device Front wheel walker  Distance Ambulated (ft) 500 ft  Range of Motion/Exercises Active  Activity Response Tolerated well  Mobility Referral Yes  $Mobility charge 1 Mobility   Pt was found in bed and agreeable to ambulate. Had no complaints throughout session and at EOS returned to sit EOB with necessities I reach and family in room.  Ferd Hibbs Mobility Specialist

## 2022-09-23 ENCOUNTER — Other Ambulatory Visit: Payer: Self-pay | Admitting: Urology

## 2022-11-02 ENCOUNTER — Ambulatory Visit (HOSPITAL_BASED_OUTPATIENT_CLINIC_OR_DEPARTMENT_OTHER)
Admission: RE | Admit: 2022-11-02 | Discharge: 2022-11-02 | Disposition: A | Discharge status: Home / Self Care | Payer: Medicare HMO | Source: Ambulatory Visit

## 2022-11-02 DIAGNOSIS — C679 Malignant neoplasm of bladder, unspecified: Secondary | ICD-10-CM | POA: Insufficient documentation

## 2022-11-02 DIAGNOSIS — Z7189 Other specified counseling: Secondary | ICD-10-CM | POA: Diagnosis not present

## 2022-11-02 DIAGNOSIS — Z719 Counseling, unspecified: Secondary | ICD-10-CM

## 2022-11-02 DIAGNOSIS — Z936 Other artificial openings of urinary tract status: Secondary | ICD-10-CM | POA: Insufficient documentation

## 2022-11-02 NOTE — Progress Notes (Signed)
[facility]   Reason for visit:  Marking for Ileal conduit, education Patient and wife are Spanish speaking and interpreter services are used for this visit Copy of "caring for your urostomy" booklet in Spanish is provided.  HPI:  Bladder malignancy Past Medical History:  Diagnosis Date   Asthma    hx of, currently no symptoms   BPH (benign prostatic hyperplasia)    Follows with PCP, Dr. Ines Bloomer @ [place], [place] 08/27/22.   COVID-19    2020 or 2021, no hospitalization   HLD (hyperlipidemia)    Follows w/ PCP, Dr. Ines Bloomer @ [place], [place] 08/27/22.   Hypothyroid    Follows w/ PCP, Dr. Ines Bloomer @ [place], [place] 08/27/22.   Pre-diabetes    01/29/22 HgbA1c 6.3   Wears glasses    No family history on file. No Known Allergies Current Outpatient Medications  Medication Sig Dispense Refill Last Dose   atorvastatin (LIPITOR) 10 MG tablet Take 1 tablet (10 mg total) by mouth daily. 30 tablet 5    finasteride (PROSCAR) 5 MG tablet Take 5 mg by mouth daily.      levothyroxine (SYNTHROID) 75 MCG tablet Take 1 tablet (75 mcg total) by mouth daily before breakfast. (Patient not taking: Reported on 09/02/2022) 90 tablet 3    tamsulosin (FLOMAX) 0.4 MG CAPS capsule Take 1 capsule (0.4 mg total) by mouth at bedtime. 90 capsule 3    traMADol (ULTRAM) 50 MG tablet Take 1 tablet (50 mg total) by mouth every 6 (six) hours as needed. (Patient not taking: Reported on 10/29/2022) 20 tablet 0    No current facility-administered medications for this encounter.   ROS  Review of Systems  Skin: Negative.   Psychiatric/Behavioral: Negative.    All other systems reviewed and are negative.  Vital signs:  BP (!) 150/79 (BP Location: Right Arm)   Pulse 86   Temp 98 F (36.7 C) (Oral)   Resp 20   SpO2 95%  Exam:  Physical Exam Vitals reviewed.  Constitutional:      Appearance: Normal appearance.  Abdominal:     Comments: Rounded firm abdomen  Skin:     General: Skin is warm and dry.  Neurological:     Mental Status: He is alert and oriented to person, place, and time.  Psychiatric:        Mood and Affect: Mood normal.        Behavior: Behavior normal.     Discussed surgical procedure and stoma creation with patient and family via [place] interpreter Explained role of the [facility] nurse team while inpatient, postoperatively Provided the patient with educational booklet and provided samples of pouching options.  Answered patient and family questions.  Demonstrated pouching and attaching to bedside drainage  Examined patient lying, sitting, and standing in order to place the marking in the patient's visual field, away from any creases or abdominal contour issues and within the rectus muscle.  Very low lying umbilicus and rounded abdomen.  Marked higher for this reason  Marked for ileal conduit in the RUQ 5 cm to the right of the umbilicus and 4  cm above the umbilicus.   Patient's abdomen cleansed with CHG wipes at site markings, allowed to air dry prior to marking.Covered mark with thin film transparent dressing to preserve mark until date of surgery.   [facility] Nurse team will follow up with patient after surgery for continue ostomy care and teaching.   [facility] [phone]  Pager [phone]  Education provided:  See above.  Rationale for marking site, additional dressing and marker if needed to darken mark     Impression/dx  Bladder malignancy Presurgical counseling/education Discussion  See above LIfe with an ostomy Supplies  Plan  See back postoperatively as needed in clinic.  [facility] team will follow inpatient.     Visit time: 50 minutes.   Domenic Moras FNP-BC

## 2022-11-03 ENCOUNTER — Inpatient Hospital Stay (HOSPITAL_COMMUNITY)
Admission: RE | Admit: 2022-11-03 | Discharge: 2022-11-10 | DRG: 654 | Disposition: A | Discharge status: Home Health | Payer: Medicare HMO | Source: Ambulatory Visit | Attending: Urology | Admitting: Urology

## 2022-11-03 ENCOUNTER — Encounter (HOSPITAL_COMMUNITY): Payer: Self-pay | Admitting: Urology

## 2022-11-03 ENCOUNTER — Other Ambulatory Visit: Payer: Self-pay

## 2022-11-03 DIAGNOSIS — Z79899 Other long term (current) drug therapy: Secondary | ICD-10-CM

## 2022-11-03 DIAGNOSIS — K567 Ileus, unspecified: Secondary | ICD-10-CM | POA: Diagnosis not present

## 2022-11-03 DIAGNOSIS — E039 Hypothyroidism, unspecified: Secondary | ICD-10-CM | POA: Diagnosis not present

## 2022-11-03 DIAGNOSIS — Z79890 Hormone replacement therapy (postmenopausal): Secondary | ICD-10-CM

## 2022-11-03 DIAGNOSIS — Z8616 Personal history of COVID-19: Secondary | ICD-10-CM

## 2022-11-03 DIAGNOSIS — E785 Hyperlipidemia, unspecified: Secondary | ICD-10-CM | POA: Diagnosis not present

## 2022-11-03 DIAGNOSIS — Z6827 Body mass index (BMI) 27.0-27.9, adult: Secondary | ICD-10-CM | POA: Diagnosis not present

## 2022-11-03 DIAGNOSIS — E669 Obesity, unspecified: Secondary | ICD-10-CM | POA: Diagnosis present

## 2022-11-03 DIAGNOSIS — Z9079 Acquired absence of other genital organ(s): Secondary | ICD-10-CM | POA: Diagnosis not present

## 2022-11-03 DIAGNOSIS — Z87891 Personal history of nicotine dependence: Secondary | ICD-10-CM | POA: Diagnosis not present

## 2022-11-03 DIAGNOSIS — C679 Malignant neoplasm of bladder, unspecified: Secondary | ICD-10-CM | POA: Diagnosis not present

## 2022-11-03 DIAGNOSIS — K9189 Other postprocedural complications and disorders of digestive system: Secondary | ICD-10-CM | POA: Diagnosis not present

## 2022-11-03 DIAGNOSIS — R310 Gross hematuria: Secondary | ICD-10-CM | POA: Diagnosis not present

## 2022-11-03 DIAGNOSIS — N400 Enlarged prostate without lower urinary tract symptoms: Secondary | ICD-10-CM | POA: Diagnosis present

## 2022-11-03 DIAGNOSIS — N35919 Unspecified urethral stricture, male, unspecified site: Secondary | ICD-10-CM | POA: Diagnosis not present

## 2022-11-03 DIAGNOSIS — Z7189 Other specified counseling: Secondary | ICD-10-CM | POA: Insufficient documentation

## 2022-11-03 DIAGNOSIS — R7303 Prediabetes: Secondary | ICD-10-CM | POA: Diagnosis present

## 2022-11-03 DIAGNOSIS — C61 Malignant neoplasm of prostate: Secondary | ICD-10-CM | POA: Diagnosis not present

## 2022-11-03 DIAGNOSIS — Z96642 Presence of left artificial hip joint: Secondary | ICD-10-CM | POA: Diagnosis present

## 2022-11-03 DIAGNOSIS — Z719 Counseling, unspecified: Secondary | ICD-10-CM | POA: Insufficient documentation

## 2022-11-03 LAB — CBC
HCT: 41.6 % (ref 39.0–52.0)
Hemoglobin: 13.1 g/dL (ref 13.0–17.0)
MCH: 25.6 pg — ABNORMAL LOW (ref 26.0–34.0)
MCHC: 31.5 g/dL (ref 30.0–36.0)
MCV: 81.3 fL (ref 80.0–100.0)
Platelets: 236 10*3/uL (ref 150–400)
RBC: 5.12 MIL/uL (ref 4.22–5.81)
RDW: 15.5 % (ref 11.5–15.5)
WBC: 4.2 10*3/uL (ref 4.0–10.5)
nRBC: 0 % (ref 0.0–0.2)

## 2022-11-03 LAB — COMPREHENSIVE METABOLIC PANEL
ALT: 12 U/L (ref 0–44)
AST: 19 U/L (ref 15–41)
Albumin: 3.8 g/dL (ref 3.5–5.0)
Alkaline Phosphatase: 93 U/L (ref 38–126)
Anion gap: 5 (ref 5–15)
BUN: 16 mg/dL (ref 8–23)
CO2: 23 mmol/L (ref 22–32)
Calcium: 8.1 mg/dL — ABNORMAL LOW (ref 8.9–10.3)
Chloride: 101 mmol/L (ref 98–111)
Creatinine, Ser: 0.94 mg/dL (ref 0.61–1.24)
GFR, Estimated: >60 mL/min (ref >60)
Glucose, Bld: 99 mg/dL (ref 70–99)
Potassium: 4.2 mmol/L (ref 3.5–5.1)
Sodium: 129 mmol/L — ABNORMAL LOW (ref 135–145)
Total Bilirubin: 0.7 mg/dL (ref 0.3–1.2)
Total Protein: 6.9 g/dL (ref 6.5–8.1)

## 2022-11-03 LAB — TYPE AND SCREEN
ABO/RH(D): O POS
Antibody Screen: NEGATIVE

## 2022-11-03 MED ORDER — NEOMYCIN SULFATE 500 MG PO TABS
500.0000 mg | ORAL_TABLET | ORAL | Status: AC
  Administered 2022-11-03 (×2): 500 mg via ORAL
  Filled 2022-11-03 (×2): qty 1

## 2022-11-03 MED ORDER — SODIUM CHLORIDE 0.9 % IV SOLN
INTRAVENOUS | Status: DC
  Administered 2022-11-07: 50 mL/h via INTRAVENOUS

## 2022-11-03 MED ORDER — ATORVASTATIN CALCIUM 10 MG PO TABS
10.0000 mg | ORAL_TABLET | Freq: Every day | ORAL | Status: DC
  Administered 2022-11-05 – 2022-11-10 (×6): 10 mg via ORAL
  Filled 2022-11-03 (×6): qty 1

## 2022-11-03 MED ORDER — PEG 3350-KCL-NA BICARB-NACL 420 G PO SOLR
4000.0000 mL | Freq: Once | ORAL | Status: AC
  Administered 2022-11-03: 4000 mL via ORAL

## 2022-11-03 MED ORDER — METRONIDAZOLE 500 MG PO TABS
500.0000 mg | ORAL_TABLET | ORAL | Status: AC
  Administered 2022-11-03 (×2): 500 mg via ORAL
  Filled 2022-11-03 (×2): qty 1

## 2022-11-03 MED ORDER — PIPERACILLIN-TAZOBACTAM 3.375 G IVPB 30 MIN
3.3750 g | INTRAVENOUS | Status: AC
  Administered 2022-11-04: 3.375 g via INTRAVENOUS
  Filled 2022-11-03: qty 50

## 2022-11-03 MED ORDER — FINASTERIDE 5 MG PO TABS: 5.0000 mg | ORAL_TABLET | Freq: Every day | ORAL | Status: DC

## 2022-11-03 NOTE — Discharge Instructions (Signed)
See back in clinic after discharge

## 2022-11-03 NOTE — H&P (Signed)
Joel Adkins is an 82 y.o. male.    Chief Complaint: Pre-OP Cystoprostatectomy  HPI:   1 - Large Volume High Grade Bladder [place] - >4cm multofocal at least T1G3 tumor by TURBT 08/2022 on eval gross hematuria. No overtly muscle invasive but high clinical suspicion and not endoscopically completely managable given massive prostate below. Single ureters bilaterally.   2 - Massive Prostate With Lower Urinary Tract Symptoms - on tamsulosin + finasteride at baseline. 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   PMH sig for left hip replacement. NO CV disease / blood thinners. HIs PCP is Joel Baptise MD. Spanish speaking, retired from facilities maintenance and custodial work in [employer]. Independent with wife at baseline. HIs son Joel Adkins and family friend Joel Adkins [phone]) are very involved and also speak good [place].   Today "Joel Adkins" is seen in preparation for cystoprostatectomy. He completed bowel prep to clear. Hgb 13.   Past Medical History:  Diagnosis Date   Asthma    hx of, currently no symptoms   BPH (benign prostatic hyperplasia)    Follows with PCP, Dr. Ines Adkins @ [place], [place] 08/27/22.   COVID-19    2020 or 2021, no hospitalization   HLD (hyperlipidemia)    Follows w/ PCP, Dr. Ines Adkins @ [place], [place] 08/27/22.   Hypothyroid    Follows w/ PCP, Dr. Ines Adkins @ [place], [place] 08/27/22.   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   Procedure: TOTAL HIP ARTHROPLASTY ANTERIOR APPROACH;  Surgeon: Joel Knows, MD;  Location: [facility];  Service: Orthopedics;  Laterality: Left;   TRANSURETHRAL RESECTION OF BLADDER TUMOR N/A 09/01/2022   Procedure: TRANSURETHRAL RESECTION OF BLADDER TUMOR (TURBT);  Surgeon:  Remi Haggard, MD;  Location: [facility];  Service: Urology;  Laterality: N/A;   TRANSURETHRAL RESECTION OF PROSTATE N/A 09/01/2022   Procedure: TRANSURETHRAL RESECTION OF THE PROSTATE (TURP);  Surgeon: Remi Haggard, MD;  Location: [facility];  Service: Urology;  Laterality: N/A;    No family history on file. Social History:  reports that he quit smoking about 58 years ago. His smoking use included cigarettes. He has never used smokeless tobacco. He reports that he does not drink alcohol and does not use drugs.  Allergies: No Known Allergies  Medications Prior to Admission  Medication Sig Dispense Refill   atorvastatin (LIPITOR) 10 MG tablet Take 1 tablet (10 mg total) by mouth daily. 30 tablet 5   finasteride (PROSCAR) 5 MG tablet Take 5 mg by mouth daily.     tamsulosin (FLOMAX) 0.4 MG CAPS capsule Take 1 capsule (0.4 mg total) by mouth at bedtime. 90 capsule 3   levothyroxine (SYNTHROID) 75 MCG tablet Take 1 tablet (75 mcg total) by mouth daily before breakfast. (Patient not taking: Reported on 09/02/2022) 90 tablet 3   traMADol (ULTRAM) 50 MG tablet Take 1 tablet (50 mg total) by mouth every 6 (six) hours as needed. (Patient not taking: Reported on 10/29/2022) 20 tablet 0    No results found for this or any previous visit (from the past 48 hour(s)). No results found.  Review of Systems  Constitutional:  Negative for fever.  HENT: Negative.  All other systems reviewed and are negative.  Blood pressure (!) 140/83, pulse 81, temperature 98.4 F (36.9 C), temperature source Oral, resp. rate 17, SpO2 96 %. Physical Exam Vitals reviewed.  HENT:     Head: Normocephalic.  Eyes:     Pupils: Pupils are equal, round, and reactive to light.  Cardiovascular:     Rate and Rhythm: Normal rate.  Abdominal:     General: Abdomen is flat.     Comments: Stable moderate truncal obesity.   Genitourinary:    Comments: No CVAT at present.  Musculoskeletal:         General: Normal range of motion.     Cervical back: Normal range of motion.  Neurological:     General: No focal deficit present.     Mental Status: He is alert.  Psychiatric:        Mood and Affect: Mood normal.  Pt interviewed with Spanish videointepreter.    Assessment/Plan  Bowel prep, stomal marking, CMP, CBC, Type and Screen. Risks, benefits, alternatives, expected peri-op course with cystectomy and diversion discussed previously over multiple visits and reiterated today.   Joel Frock, MD 11/03/2022, 4:27 PM

## 2022-11-04 ENCOUNTER — Other Ambulatory Visit: Payer: Self-pay

## 2022-11-04 ENCOUNTER — Inpatient Hospital Stay (HOSPITAL_COMMUNITY): Payer: Medicare HMO | Admitting: Certified Registered"

## 2022-11-04 ENCOUNTER — Encounter (HOSPITAL_COMMUNITY)
Admission: RE | Disposition: A | Discharge status: Home / Self Care | Payer: Self-pay | Source: Ambulatory Visit | Attending: Urology

## 2022-11-04 ENCOUNTER — Encounter (HOSPITAL_COMMUNITY): Payer: Self-pay | Admitting: Urology

## 2022-11-04 DIAGNOSIS — N35919 Unspecified urethral stricture, male, unspecified site: Secondary | ICD-10-CM

## 2022-11-04 DIAGNOSIS — C679 Malignant neoplasm of bladder, unspecified: Secondary | ICD-10-CM

## 2022-11-04 DIAGNOSIS — N400 Enlarged prostate without lower urinary tract symptoms: Secondary | ICD-10-CM

## 2022-11-04 HISTORY — PX: CYSTOSCOPY WITH INJECTION: SHX1424

## 2022-11-04 HISTORY — PX: ROBOT ASSISTED LAPAROSCOPIC COMPLETE CYSTECT ILEAL CONDUIT: SHX5139

## 2022-11-04 HISTORY — PX: LYMPH NODE DISSECTION: SHX5087

## 2022-11-04 LAB — HEMOGLOBIN AND HEMATOCRIT, BLOOD
HCT: 42.7 % (ref 39.0–52.0)
Hemoglobin: 12.9 g/dL — ABNORMAL LOW (ref 13.0–17.0)

## 2022-11-04 SURGERY — CYSTECTOMY, ROBOT-ASSISTED, WITH ILEAL CONDUIT CREATION
Anesthesia: General | Site: Bladder

## 2022-11-04 MED ORDER — ACETAMINOPHEN 10 MG/ML IV SOLN
INTRAVENOUS | Status: DC | PRN
  Administered 2022-11-04: 1000 mg via INTRAVENOUS

## 2022-11-04 MED ORDER — SODIUM CHLORIDE (PF) 0.9 % IJ SOLN
INTRAMUSCULAR | Status: AC
  Filled 2022-11-04: qty 20

## 2022-11-04 MED ORDER — KETAMINE HCL 10 MG/ML IJ SOLN
INTRAMUSCULAR | Status: DC | PRN
  Administered 2022-11-04: 20 mg via INTRAVENOUS

## 2022-11-04 MED ORDER — KETAMINE HCL 10 MG/ML IJ SOLN
INTRAMUSCULAR | Status: AC
  Filled 2022-11-04: qty 1

## 2022-11-04 MED ORDER — PROPOFOL 10 MG/ML IV BOLUS
INTRAVENOUS | Status: AC
  Filled 2022-11-04: qty 20

## 2022-11-04 MED ORDER — SODIUM CHLORIDE (PF) 0.9 % IJ SOLN
INTRAMUSCULAR | Status: DC | PRN
  Administered 2022-11-04: 20 mL

## 2022-11-04 MED ORDER — ALVIMOPAN 12 MG PO CAPS: 12.0000 mg | ORAL_CAPSULE | Freq: Two times a day (BID) | ORAL | Status: DC

## 2022-11-04 MED ORDER — LIDOCAINE 2% (20 MG/ML) 5 ML SYRINGE
INTRAMUSCULAR | Status: DC | PRN
  Administered 2022-11-04: 1.5 mg/kg/h via INTRAVENOUS

## 2022-11-04 MED ORDER — ACETAMINOPHEN 10 MG/ML IV SOLN
INTRAVENOUS | Status: AC
  Filled 2022-11-04: qty 100

## 2022-11-04 MED ORDER — FENTANYL CITRATE (PF) 100 MCG/2ML IJ SOLN
INTRAMUSCULAR | Status: AC
  Filled 2022-11-04: qty 2

## 2022-11-04 MED ORDER — LACTATED RINGERS IV SOLN: INTRAVENOUS | Status: DC | PRN

## 2022-11-04 MED ORDER — EPHEDRINE SULFATE (PRESSORS) 50 MG/ML IJ SOLN
INTRAMUSCULAR | Status: DC | PRN
  Administered 2022-11-04: 10 mg via INTRAVENOUS

## 2022-11-04 MED ORDER — PHENYLEPHRINE 80 MCG/ML (10ML) SYRINGE FOR IV PUSH (FOR BLOOD PRESSURE SUPPORT)
PREFILLED_SYRINGE | INTRAVENOUS | Status: AC
  Filled 2022-11-04: qty 10

## 2022-11-04 MED ORDER — LIDOCAINE IN D5W 4-5 MG/ML-% IV SOLN: INTRAVENOUS | Status: DC | PRN

## 2022-11-04 MED ORDER — ALBUMIN HUMAN 5 % IV SOLN: INTRAVENOUS | Status: DC | PRN

## 2022-11-04 MED ORDER — OXYCODONE HCL 5 MG PO TABS
5.0000 mg | ORAL_TABLET | ORAL | Status: DC | PRN
  Administered 2022-11-04 – 2022-11-09 (×12): 5 mg via ORAL
  Filled 2022-11-04 (×13): qty 1

## 2022-11-04 MED ORDER — INDOCYANINE GREEN 25 MG IV SOLR
INTRAVENOUS | Status: DC | PRN
  Administered 2022-11-04: 10 mg

## 2022-11-04 MED ORDER — OXYCODONE HCL 5 MG PO TABS
ORAL_TABLET | ORAL | Status: AC
  Filled 2022-11-04: qty 1

## 2022-11-04 MED ORDER — ALBUMIN HUMAN 5 % IV SOLN
INTRAVENOUS | Status: AC
  Filled 2022-11-04: qty 250

## 2022-11-04 MED ORDER — ROCURONIUM BROMIDE 10 MG/ML (PF) SYRINGE
PREFILLED_SYRINGE | INTRAVENOUS | Status: AC
  Filled 2022-11-04: qty 30

## 2022-11-04 MED ORDER — SODIUM CHLORIDE 0.45 % IV SOLN: INTRAVENOUS | Status: DC

## 2022-11-04 MED ORDER — HYDROMORPHONE HCL 1 MG/ML IJ SOLN
INTRAMUSCULAR | Status: AC
  Filled 2022-11-04: qty 1

## 2022-11-04 MED ORDER — SENNOSIDES-DOCUSATE SODIUM 8.6-50 MG PO TABS
2.0000 | ORAL_TABLET | Freq: Every day | ORAL | Status: DC
  Administered 2022-11-05 – 2022-11-09 (×5): 2 via ORAL
  Filled 2022-11-04 (×6): qty 2

## 2022-11-04 MED ORDER — ACETAMINOPHEN 10 MG/ML IV SOLN
1000.0000 mg | Freq: Four times a day (QID) | INTRAVENOUS | Status: AC
  Administered 2022-11-04 – 2022-11-05 (×4): 1000 mg via INTRAVENOUS
  Filled 2022-11-04 (×4): qty 100

## 2022-11-04 MED ORDER — BUPIVACAINE LIPOSOME 1.3 % IJ SUSP
INTRAMUSCULAR | Status: AC
  Filled 2022-11-04: qty 20

## 2022-11-04 MED ORDER — DEXAMETHASONE SODIUM PHOSPHATE 4 MG/ML IJ SOLN
INTRAMUSCULAR | Status: DC | PRN
  Administered 2022-11-04: 5 mg via INTRAVENOUS

## 2022-11-04 MED ORDER — FENTANYL CITRATE (PF) 100 MCG/2ML IJ SOLN
INTRAMUSCULAR | Status: DC | PRN
  Administered 2022-11-04 (×3): 50 ug via INTRAVENOUS

## 2022-11-04 MED ORDER — STERILE WATER FOR IRRIGATION IR SOLN
Status: DC | PRN
  Administered 2022-11-04: 3000 mL

## 2022-11-04 MED ORDER — SUGAMMADEX SODIUM 200 MG/2ML IV SOLN
INTRAVENOUS | Status: DC | PRN
  Administered 2022-11-04: 200 mg via INTRAVENOUS

## 2022-11-04 MED ORDER — LACTATED RINGERS IV SOLN: INTRAVENOUS | Status: DC

## 2022-11-04 MED ORDER — PIPERACILLIN-TAZOBACTAM 3.375 G IVPB
3.3750 g | Freq: Three times a day (TID) | INTRAVENOUS | Status: AC
  Administered 2022-11-04 – 2022-11-05 (×3): 3.375 g via INTRAVENOUS
  Filled 2022-11-04 (×3): qty 50

## 2022-11-04 MED ORDER — WATER FOR IRRIGATION, STERILE IR SOLN
Status: DC | PRN
  Administered 2022-11-04: 1000 mL

## 2022-11-04 MED ORDER — DIPHENHYDRAMINE HCL 50 MG/ML IJ SOLN: 12.5000 mg | Freq: Four times a day (QID) | INTRAMUSCULAR | Status: DC | PRN

## 2022-11-04 MED ORDER — DIPHENHYDRAMINE HCL 12.5 MG/5ML PO ELIX: 12.5000 mg | ORAL_SOLUTION | Freq: Four times a day (QID) | ORAL | Status: DC | PRN

## 2022-11-04 MED ORDER — PROMETHAZINE HCL 25 MG/ML IJ SOLN: 12.5000 mg | Freq: Once | INTRAMUSCULAR | Status: DC | PRN

## 2022-11-04 MED ORDER — ROCURONIUM BROMIDE 100 MG/10ML IV SOLN
INTRAVENOUS | Status: DC | PRN
  Administered 2022-11-04 (×2): 20 mg via INTRAVENOUS
  Administered 2022-11-04: 60 mg via INTRAVENOUS
  Administered 2022-11-04: 20 mg via INTRAVENOUS

## 2022-11-04 MED ORDER — EPHEDRINE 5 MG/ML INJ
INTRAVENOUS | Status: AC
  Filled 2022-11-04: qty 5

## 2022-11-04 MED ORDER — HYDROMORPHONE HCL 1 MG/ML IJ SOLN
0.2500 mg | INTRAMUSCULAR | Status: DC | PRN
  Administered 2022-11-04 (×2): 0.5 mg via INTRAVENOUS

## 2022-11-04 MED ORDER — ONDANSETRON HCL 4 MG/2ML IJ SOLN
INTRAMUSCULAR | Status: DC | PRN
  Administered 2022-11-04: 4 mg via INTRAVENOUS

## 2022-11-04 MED ORDER — PROPOFOL 10 MG/ML IV BOLUS
INTRAVENOUS | Status: DC | PRN
  Administered 2022-11-04: 150 mg via INTRAVENOUS

## 2022-11-04 MED ORDER — BUPIVACAINE LIPOSOME 1.3 % IJ SUSP
INTRAMUSCULAR | Status: DC | PRN
  Administered 2022-11-04: 20 mL

## 2022-11-04 MED ORDER — PHENYLEPHRINE HCL-NACL 20-0.9 MG/250ML-% IV SOLN
INTRAVENOUS | Status: DC | PRN
  Administered 2022-11-04: 20 ug/min via INTRAVENOUS

## 2022-11-04 MED ORDER — PHENYLEPHRINE HCL-NACL 20-0.9 MG/250ML-% IV SOLN
INTRAVENOUS | Status: AC
  Filled 2022-11-04: qty 250

## 2022-11-04 MED ORDER — ONDANSETRON HCL 4 MG/2ML IJ SOLN
4.0000 mg | INTRAMUSCULAR | Status: DC | PRN
  Administered 2022-11-06: 4 mg via INTRAVENOUS
  Filled 2022-11-04: qty 2

## 2022-11-04 MED ORDER — LACTATED RINGERS IR SOLN
Status: DC | PRN
  Administered 2022-11-04: 1000 mL

## 2022-11-04 MED ORDER — PIPERACILLIN-TAZOBACTAM 3.375 G IVPB
INTRAVENOUS | Status: AC
  Filled 2022-11-04: qty 50

## 2022-11-04 MED ORDER — HYDROMORPHONE HCL 1 MG/ML IJ SOLN
0.5000 mg | INTRAMUSCULAR | Status: DC | PRN
  Administered 2022-11-04 – 2022-11-07 (×4): 1 mg via INTRAVENOUS
  Filled 2022-11-04 (×4): qty 1

## 2022-11-04 SURGICAL SUPPLY — 112 items
ADH SKN CLS APL DERMABOND .7 (GAUZE/BANDAGES/DRESSINGS) ×3
AGENT HMST KT MTR STRL THRMB (HEMOSTASIS)
APL ESCP 34 STRL LF DISP (HEMOSTASIS)
APL PRP STRL LF DISP 70% ISPRP (MISCELLANEOUS) ×3
APL SKNCLS STERI-STRIP NONHPOA (GAUZE/BANDAGES/DRESSINGS)
APL SWBSTK 6 STRL LF DISP (MISCELLANEOUS) ×3
APPLICATOR COTTON TIP 6 STRL (MISCELLANEOUS) ×4 IMPLANT
APPLICATOR COTTON TIP 6IN STRL (MISCELLANEOUS) ×3
APPLICATOR SURGIFLO ENDO (HEMOSTASIS) IMPLANT
BAG COUNTER SPONGE SURGICOUNT (BAG) IMPLANT
BAG LAPAROSCOPIC 12 15 PORT 16 (BASKET) ×4 IMPLANT
BAG RETRIEVAL 12/15 (BASKET) ×3
BAG SPNG CNTER NS LX DISP (BAG)
BAG URO CATCHER STRL LF (MISCELLANEOUS) ×4 IMPLANT
BALLN NEPHROSTOMY (BALLOONS) ×3
BALLOON NEPHROSTOMY (BALLOONS) IMPLANT
BENZOIN TINCTURE PRP APPL 2/3 (GAUZE/BANDAGES/DRESSINGS) IMPLANT
BLADE SURG SZ10 CARB STEEL (BLADE) IMPLANT
CATH SILICONE 5CC 18FR (INSTRUMENTS) ×4 IMPLANT
CELLS DAT CNTRL 66122 CELL SVR (MISCELLANEOUS) ×3 IMPLANT
CHLORAPREP W/TINT 26 (MISCELLANEOUS) ×4 IMPLANT
CLIP LIGATING HEM O LOK PURPLE (MISCELLANEOUS) ×8 IMPLANT
CLIP LIGATING HEMO LOK XL GOLD (MISCELLANEOUS) ×8 IMPLANT
CLIP LIGATING HEMO O LOK GREEN (MISCELLANEOUS) ×4 IMPLANT
CLOTH BEACON ORANGE TIMEOUT ST (SAFETY) ×4 IMPLANT
CNTNR URN SCR LID CUP LEK RST (MISCELLANEOUS) ×4 IMPLANT
CONT SPEC 4OZ STRL OR WHT (MISCELLANEOUS) ×3
COVER SURGICAL LIGHT HANDLE (MISCELLANEOUS) ×4 IMPLANT
COVER TIP SHEARS 8 DVNC (MISCELLANEOUS) ×4 IMPLANT
COVER TIP SHEARS 8MM DA VINCI (MISCELLANEOUS) ×3
DERMABOND ADVANCED .7 DNX12 (GAUZE/BANDAGES/DRESSINGS) ×8 IMPLANT
DRAIN CHANNEL RND F F (WOUND CARE) IMPLANT
DRAIN PENROSE 0.5X18 (DRAIN) IMPLANT
DRAPE ARM DVNC X/XI (DISPOSABLE) ×16 IMPLANT
DRAPE COLUMN DVNC XI (DISPOSABLE) ×4 IMPLANT
DRAPE DA VINCI XI ARM (DISPOSABLE) ×12
DRAPE DA VINCI XI COLUMN (DISPOSABLE) ×3
DRAPE SURG IRRIG POUCH 19X23 (DRAPES) ×4 IMPLANT
DRSG TEGADERM 4X4.75 (GAUZE/BANDAGES/DRESSINGS) IMPLANT
ELECT PENCIL ROCKER SW 15FT (MISCELLANEOUS) ×4 IMPLANT
ELECT REM PT RETURN 15FT ADLT (MISCELLANEOUS) ×4 IMPLANT
GAUZE 4X4 16PLY ~~LOC~~+RFID DBL (SPONGE) IMPLANT
GLOVE BIO SURGEON STRL SZ 6.5 (GLOVE) ×8 IMPLANT
GLOVE BIOGEL PI IND STRL 7.5 (GLOVE) IMPLANT
GLOVE SURG LX STRL 7.5 STRW (GLOVE) ×12 IMPLANT
GOWN SRG XL LVL 4 BRTHBL STRL (GOWNS) ×4 IMPLANT
GOWN STRL NON-REIN XL LVL4 (GOWNS) ×3
GOWN STRL REUS W/ TWL XL LVL3 (GOWN DISPOSABLE) ×12 IMPLANT
GOWN STRL REUS W/TWL XL LVL3 (GOWN DISPOSABLE) ×9
IRRIG SUCT STRYKERFLOW 2 WTIP (MISCELLANEOUS) ×3
IRRIGATION SUCT STRKRFLW 2 WTP (MISCELLANEOUS) ×4 IMPLANT
KIT PROCEDURE DA VINCI SI (MISCELLANEOUS)
KIT PROCEDURE DVNC SI (MISCELLANEOUS) IMPLANT
KIT TURNOVER KIT A (KITS) IMPLANT
LOOP VESSEL MAXI BLUE (MISCELLANEOUS) ×4 IMPLANT
MANIFOLD NEPTUNE II (INSTRUMENTS) ×4 IMPLANT
NDL ASPIRATION 22 (NEEDLE) ×4 IMPLANT
NDL INSUFFLATION 14GA 120MM (NEEDLE) ×4 IMPLANT
NEEDLE ASPIRATION 22 (NEEDLE) ×3 IMPLANT
NEEDLE INSUFFLATION 14GA 120MM (NEEDLE) ×3 IMPLANT
PACK CYSTO (CUSTOM PROCEDURE TRAY) ×4 IMPLANT
PACK ROBOT UROLOGY CUSTOM (CUSTOM PROCEDURE TRAY) ×4 IMPLANT
PAD POSITIONING PINK XL (MISCELLANEOUS) ×4 IMPLANT
PORT ACCESS TROCAR AIRSEAL 12 (TROCAR) ×4 IMPLANT
RELOAD STAPLE 60 2.6 WHT THN (STAPLE) ×12 IMPLANT
RELOAD STAPLE 60 4.1 GRN THCK (STAPLE) ×12 IMPLANT
RELOAD STAPLER GREEN 60MM (STAPLE) ×9 IMPLANT
RELOAD STAPLER WHITE 60MM (STAPLE) ×9 IMPLANT
RETRACTOR LONRSTAR 16.6X16.6CM (MISCELLANEOUS) IMPLANT
RETRACTOR STAY HOOK 5MM (MISCELLANEOUS) IMPLANT
RETRACTOR STER APS 16.6X16.6CM (MISCELLANEOUS)
RETRACTOR WND ALEXIS 18 MED (MISCELLANEOUS) ×4 IMPLANT
RTRCTR WOUND ALEXIS 18CM MED (MISCELLANEOUS) ×3
SEAL CANN UNIV 5-8 DVNC XI (MISCELLANEOUS) ×16 IMPLANT
SEAL XI 5MM-8MM UNIVERSAL (MISCELLANEOUS) ×12
SET TRI-LUMEN FLTR TB AIRSEAL (TUBING) ×4 IMPLANT
SOL ELECTROSURG ANTI STICK (MISCELLANEOUS) ×3
SOLUTION ELECTROSURG ANTI STCK (MISCELLANEOUS) ×4 IMPLANT
SPIKE FLUID TRANSFER (MISCELLANEOUS) ×4 IMPLANT
SPONGE T-LAP 4X18 ~~LOC~~+RFID (SPONGE) ×4 IMPLANT
STAPLER ECHELON LONG 60 440 (INSTRUMENTS) ×4 IMPLANT
STAPLER RELOAD GREEN 60MM (STAPLE) ×9
STAPLER RELOAD WHITE 60MM (STAPLE) ×9
STENT SET URETHERAL LEFT 7FR (STENTS) ×4 IMPLANT
STENT SET URETHERAL RIGHT 7FR (STENTS) ×4 IMPLANT
SURGIFLO W/THROMBIN 8M KIT (HEMOSTASIS) IMPLANT
SUT CHROMIC 4 0 RB 1X27 (SUTURE) ×4 IMPLANT
SUT ETHILON 3 0 PS 1 (SUTURE) ×4 IMPLANT
SUT MNCRL AB 4-0 PS2 18 (SUTURE) ×8 IMPLANT
SUT PDS AB 1 CT1 27 (SUTURE) ×12 IMPLANT
SUT SILK 3 0 SH 30 (SUTURE) IMPLANT
SUT SILK 3 0 SH CR/8 (SUTURE) ×4 IMPLANT
SUT VIC AB 2-0 CT1 27 (SUTURE)
SUT VIC AB 2-0 CT1 27XBRD (SUTURE) IMPLANT
SUT VIC AB 2-0 SH 18 (SUTURE) IMPLANT
SUT VIC AB 2-0 UR5 27 (SUTURE) ×16 IMPLANT
SUT VIC AB 3-0 SH 27 (SUTURE) ×18
SUT VIC AB 3-0 SH 27X BRD (SUTURE) ×8 IMPLANT
SUT VIC AB 3-0 SH 27XBRD (SUTURE) ×16 IMPLANT
SUT VIC AB 4-0 RB1 27 (SUTURE) ×12
SUT VIC AB 4-0 RB1 27XBRD (SUTURE) ×16 IMPLANT
SUT VLOC BARB 180 ABS3/0GR12 (SUTURE) ×3
SUTURE VLOC BRB 180 ABS3/0GR12 (SUTURE) ×4 IMPLANT
SYR CONTROL 10ML LL (SYRINGE) IMPLANT
SYS KII OPTICAL ACCESS 15MM (TROCAR) ×3
SYSTEM KII OPTICAL ACCESS 15MM (TROCAR) ×4 IMPLANT
SYSTEM UROSTOMY GENTLE TOUCH (WOUND CARE) ×4 IMPLANT
TOWEL OR NON WOVEN STRL DISP B (DISPOSABLE) ×4 IMPLANT
TROCAR Z-THREAD FIOS 5X100MM (TROCAR) IMPLANT
TUBING CONNECTING 10 (TUBING) ×4 IMPLANT
WATER STERILE IRR 1000ML POUR (IV SOLUTION) ×4 IMPLANT
WATER STERILE IRR 3000ML UROMA (IV SOLUTION) ×4 IMPLANT

## 2022-11-04 NOTE — Anesthesia Postprocedure Evaluation (Signed)
Anesthesia Post Note  Patient: Joel Adkins  Procedure(s) Performed: XI ROBOTIC ASSISTED LAPAROSCOPIC COMPLETE CYSTECT ILEAL CONDUIT (Abdomen) CYSTOSCOPY WITH INJECTION OF INDOCYANINE GREEN DYE urethral dilation (Bladder) PELVIC LYMPH NODE DISSECTION (Bilateral: Abdomen)     Patient location during evaluation: PACU Anesthesia Type: General Level of consciousness: awake and sedated Pain management: pain level controlled Vital Signs Assessment: post-procedure vital signs reviewed and stable Respiratory status: spontaneous breathing Cardiovascular status: stable Postop Assessment: no apparent nausea or vomiting Anesthetic complications: no  No notable events documented.  Last Vitals:  Vitals:   11/04/22 1445 11/04/22 1500  BP: 114/60   Pulse: 80 80  Resp:    Temp:    SpO2: 90% 95%    Last Pain:  Vitals:   11/04/22 1500  TempSrc:   PainSc: [place] Jr

## 2022-11-04 NOTE — Anesthesia Preprocedure Evaluation (Addendum)
Anesthesia Evaluation  Patient identified by MRN, date of birth, ID band Patient awake    Reviewed: Allergy & Precautions, NPO status , Unable to perform ROS - Chart review only  Airway Mallampati: II       Dental  (+) Poor Dentition, Missing   Pulmonary former smoker   Pulmonary exam normal        Cardiovascular negative cardio ROS Normal cardiovascular exam     Neuro/Psych negative neurological ROS  negative psych ROS   GI/Hepatic negative GI ROS, Neg liver ROS,,,  Endo/Other  Hypothyroidism    Renal/GU negative Renal ROS  negative genitourinary   Musculoskeletal negative musculoskeletal ROS (+)    Abdominal Normal abdominal exam  (+)   Peds  Hematology negative hematology ROS (+)   Anesthesia Other Findings   Reproductive/Obstetrics                             Anesthesia Physical Anesthesia Plan  ASA: 2  Anesthesia Plan: General   Post-op Pain Management: Ofirmev IV (intra-op)*   Induction: Intravenous  PONV Risk Score and Plan: 4 or greater and Ondansetron, Dexamethasone and Midazolam  Airway Management Planned: Oral ETT  Additional Equipment: ClearSight  Intra-op Plan:   Post-operative Plan: Extubation in OR  Informed Consent: I have reviewed the patients History and Physical, chart, labs and discussed the procedure including the risks, benefits and alternatives for the proposed anesthesia with the patient or authorized representative who has indicated his/her understanding and acceptance.     Dental advisory given  Plan Discussed with: CRNA  Anesthesia Plan Comments: (2 PIV and clear site or [place])       Anesthesia Quick Evaluation

## 2022-11-04 NOTE — Anesthesia Procedure Notes (Signed)
Procedure Name: Intubation Date/Time: 11/04/2022 9:21 AM  Performed by: Timoteo Expose, CRNAPre-anesthesia Checklist: Patient identified, Emergency Drugs available, Suction available and Patient being monitored Patient Re-evaluated:Patient Re-evaluated prior to induction Oxygen Delivery Method: Circle system utilized Preoxygenation: Pre-oxygenation with 100% oxygen Induction Type: IV induction Ventilation: Mask ventilation without difficulty Laryngoscope Size: McGraph and 4 Grade View: Grade II Tube type: Oral Tube size: 7.5 mm Number of attempts: 1 Airway Equipment and Method: Stylet and Oral airway Placement Confirmation: ETT inserted through vocal cords under direct vision, positive ETCO2 and breath sounds checked- equal and bilateral Secured at: 22 cm Tube secured with: Tape Dental Injury: Teeth and Oropharynx as per pre-operative assessment

## 2022-11-04 NOTE — Discharge Instructions (Signed)

## 2022-11-04 NOTE — Op Note (Signed)
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  PROCEDURES PERFORMED:   1.  Cystoscopy with urethral dilation. 2.  Injection of ICG dye in bladder for sentinal lymphangioraphy. 3.  Robotic-assisted laparoscopic radical cystectomy and prostatectomy. 4.  Bilateral pelvic lymph node dissection. 5.  Ileal conduit diversion.  ESTIMATED BLOOD LOSS:  200 mL.  COMPLICATIONS:  None.  SPECIMENS:  1.  Right distal ureteral margin. 2.  Left posterior margin. 3.  Right external iliac lymph nodes. 4.  Right obturator lymph nodes. 5.  Right common iliac lymph nodes. 6.  Left common iliac lymph nodes and internal iliac lymph nodes. 7.  Left external iliac lymph nodes. 8.  Left obturator lymph nodes. 9.  Bladder plus prostate en bloc.  ASSISTANT:  Debbrah Alar, PA  DRAINS: 1.  Jackson-Pratt drain to bulb suction. 2.  Right lower quadrant urostomy to gravity drainage with right (red) and left (blue) bander stents to gravity drainage.  FINDINGS: 1.  Dense urethral stricture, estimated 6-French predilation and 24-French post-dilation. 2.  Small volume intraluminal residual tumor, right lateral bladder wall, very large prostate. 3.  Sentinel lymph nodes in the right pelvis. 4.  Evidence of bilateral inguinal hernia repair with mesh in the internal ring bilaterally.  INDICATIONS:  The patient is a very pleasant 82 year old man who is Spanish speaking only, has excellent family support and excellent functional status.  He was found on workup of hematuria and irritative voiding to have a massive prostate greater than  200 grams, as well as large  volume high-grade bladder cancer at least stage I.  He underwent attempted endoscopic resection previously where high-grade disease was documented however, complete endoscopic management was essentially impossible given the  massive size of his prostate and inherent angulation restrictions.  Options were discussed for further management including curative and noncurative intent modalities with the most aggressive being cystectomy and urinary  diversion.  He has been counseled by multiple providers and all agree cystectomy best option for long term cure / local control.  He was admitted yesterday for bowel prep stomal marking and labs, which were all favorable.  Informed consent was obtained and placed in medical record.  DESCRIPTION OF THE PROCEDURE:  The patient being Jaikob Ishimoto identified and the procedure being radical prostatectomy with conduit diversion of the prostate, cystoscopy with ICG injection was confirmed.  Procedure timeout was performed and GETA  administered.  General endotracheal anesthesia induced.  The patient initially placed into a low lithotomy position.  Sterile field was created, prepped and draped the patient's penis, perineum, and proximal thighs using iodine and his infra-xiphoid  abdomen using chlorhexidine gluconate after clipper shaving. His arms were tucked at the side with gel rolls.  He was further fastened to operating table using 3-inch tape with foam padding across supraxiphoid chest.  At est of steep Trendelenburg positioning was  performed and he is suitably positioned.  Attention was directed at cystoscopy with injection of ICG dye.  The patient was immediately noted to have a very dense stricture in the fossa navicularis portion of the penis.  This did not accommodate rigid cystoscope  or even small sized rigid dilators.  Fortunately, a sensor wire was able to be  cannulated into the true urethra, and a 24-French, NephroMax balloon dilation apparatus was carefully  placed.  This was inflated to a pressure of 20 atmospheres, held for 90  seconds and released.  Fortunately, this resolved the area of distal stricture and cystourethroscopy was performed with a 24-French rigid cystoscope with a 0-degree lens.  The patient's anterior urethra was unremarkable other than the distal stricture.   Inspection of the proximal urethra demonstrated a very large prostatic hypertrophy, as noted previously.  Inspection of urinary bladder revealed a very capacious bladder with mild trabeculation.  There was some scant residual tumor in the right bladder  wall.  Next, 2 mL indocyanine green dye was injected across several submucosal blebs in the area of papillary tumor as well as the intertrigone area and a new silicone Foley catheter was placed free to straight drain, 10 mL water in the balloon.  Next, a  high flow, low pressure, pneumoperitoneum was obtained with Veress technique in the supraumbilical midline having passed the aspiration and drop test.  An 8 mm robotic camera port was  then placed in position.  Laparoscopic examination of the peritoneal  cavity revealed some loose colonic adhesions in the left lower quadrant and evidence of prior inguinal hernia repair with mesh and internal rings bilaterally.  Additional ports were then placed as follows:  Right paramedian 8 mm robotic port, right far  lateral 12 mm AirSeal assist port, right paramedian 15 mm assistant port at the previously marked stomal site, left paramedian 8 mm robotic port, left far lateral 8 mm robotic port.  Robot was docked and passed the electronic checks.  Initial attention  was directed at development of the left retroperitoneum.  Incision made lateral to the descending colon from the area of the iliac vessel superiorly for a distance of approximately 18 cm and then distally to the deep pelvis coursing lateral to the left  median umbilical ligament towards the anterior abdominal wall.  This created a large  retroperitoneal flap , it was used to retract the colon, small bowel medially.  Left ureter was encountered as it coursed over the iliac vessels, marked a vessel loop,  dissected proximally to the area of the gonadal crossing, it was approximately 10 cm superior to the iliac vessels distally to the ureterovesical junction, which was doubly clipped and ligated with the proximal clip containing a dyed tag suture.  Frozen  section negative for carcinoma.  Left ureter was tucked out of true pelvis.  The left bladder wall swept away the pelvic sidewall towards the area of the endopelvic fascia,  the prostate was also away from the pelvic sidewall, posterior to the apex of the prostate.   Prostate was quite large as anticipated.  This exposed the area of the prominent lymph nodes in the left side.  Notably, there was some reactive changes in the area of prior mesh hernia repair as anticipated and some loose adhesions. Pelvis inspected  under near infrared fluorescence light and there was no evidence of sentinel lymph nodes in the left pelvis.  As such, standard template lymph node dissection was performed, first of the left external iliac group with boundaries being left external iliac  artery, vein, pelvic sidewall.  Lymphostasis was achieved with cold clips.  Next, left obturator group was dissected free with the boundaries being left external iliac vein, pelvic sidewall, obturator nerve.  Lymphostasis was achieved with cold clips,  set aside labeled left obturator lymph nodes.  Left obturator nerve was inspected  following maneuvers and found to be uninjured.  Next, additional fibrofatty tissue overlying the area of the common iliac vessels was dissected free. The common iliac  vessels were quite tortuous and set aside labeled as left common iliac lymph nodes.  Notably, there was a paucity of tissue in the area of the internal iliacs and no formal dissection was performed in this area.  Attention was then  directed at right  sided dissection.  The area of the ileocecal junction was noted and it was also verified by the appendix.  Incision made lateral to the cecum superiorly for a distance of approximately 18 cm to create a large retroperitoneal flap, which was then carried  inferiorly lateral to the right median umbilical ligament towards the area of the anterior abdominal wall and a Y-shaped extension was made off of this coursing along the course of the common iliac vessels towards the area of the aortic bifurcation.   This created a large peritoneal flap on the right side.  The ileocecal junction was then verified again and a segment of distal ileum approximately 14 cm proximal to this was noted to have sufficient vascularity and mobility for conduit formation.  This  was tagged using Hem-o-Lok clip with a silk suture on the epiploic fat and a free clip distal to this to denote proximal, distal orientation.  The right ureter was encountered as it coursed the iliac vessels on the right side, marked with a vessel loop,  dissected proximally circumferentially to the area of the gonadal crossing, distal to the ureterovesical junction, which we doubly clipped and ligated with a proximal clip containing a white tag suture.  Frozen section negative for carcinoma.  Right  ureter was tucked out of true pelvis.  The right bladder wall swept away the pelvic sidewall on the right side towards the area of the endopelvic fascia, which was also swept away from the pelvic sidewall, allowing access to the right sided pelvic lymph  node fields and right sided node dissection was performed of the right external iliac group, right obturator groups, right common iliac groups respectively.  Notably, there was some sentinel lymphatic tissue noted in the area of the common iliac vessels between the  artery and vein.  These were very carefully mobilized and labeled as such.  Right obturator nerve was inspected following maneuvers  and found to be uninjured.  Next, a retroperitoneal tunnel was created just anterior to the aortic bifurcation but behind  the colon and left ureter was brought through this to the right side and the right ureter, left ureter, terminal ileum tag sutures were clipped together.  Attention was directed at posterior dissection.  A posterior peritoneal flap was created behind the  bladder and this plane was dissected just posterior to the vas and seminal vesicles, all the way to the apex of the prostate.  This allowed for very good visualization of the vascular pedicles of the bladder and prostate and it was controlled using  white load stapler x 2 each side, taking exquisite care to avoid any injury to the obturator nerves.  Next, a space of Retzius was developed in the medial umbilical ligaments. The anterior base of the prostate was visualized as was the dorsal venous  complex, it was controlled using green load stapler. Bladder and prostate was placed on superior traction.  The membranous urethra was transected coldly and an in situ silicone catheter was doubly clipped and ligated using bucket handle.  The prostate  and bladder  specimen was placed into large EndoCatch bag for later retrieval.  Digital rectal exam was performed using indicator glove.  No evidence of rectal violation was noted.  Urethral stump was oversewn using running 3-0 V-Loc suture to prevent  prolonged drainage.  We achieved the goals of the extirpative portion of the procedure today.  Sponge and needle counts were correct.  Hemostasis was quite good.  A closed suction drain was brought out the previous left lateral most trocar site into the area of  the deep pelvis.  The specimen was bagged and it was swept to the left side of the hemi-abdomen with the bag string coming through the left paramedian robotic port site, of the right ureter, left ureter, terminal ileum tag sutures were grasped with a  self-locking grasper via the 15 mm port  site such that these structures lie on the right side of the hemi-abdomen.  Robot was then undocked.    The specimen was retrieved by extending the previous camera port site inferiorly,   to the left side of the umbilicus for a total distance of approximately 6 cm from the bladder plus prostate.  En bloc specimen was removed, set aside for pathology.  An Alexis type retractor was placed via this site and the right ureter, left ureter,  terminal ileum tag sutures were brought through this and these structures were grasped with Babcock forceps each.  Attention was directed at conduit formation.  The position noted for the conduit of the bowel, did appear to be  viable and mobile  for conduit formation.  This was taken out of continuity using a green load stapler, proximal and distal with the conduit length being 14 cm.  The mesentery was divided using white load stapler times 1.5 distal, 1 load proximal, taking exquisite care to  avoid devascularization of the conduit or anastomotic segments and I was quite happy with viability of this. Conduit was lined to retroperitoneal orientation and bowel-bowel anastomosis was performed using 1.5 loads of green load stapler on the  antimesenteric border.  The free end oversewn using running silk, second imbricating layer of running silk.  The acute angle anastomosis was bolstered with interrupted silk.  The mesenteric defect was reapproximated using interrupted silk.  Bowel-bowel  anastomosis was visibly viable and palpably patent, redelivered into abdominal cavity.  The proximal staple line of the conduit was excluded using running Vicryl. Distal staple line was removed as were the tagged clips.  Attention was directed at  ureteroenteric anastomosis.  First, the right ureter, a 4 mm segment of proximal conduit bowel, mucosal serosa was excised and 4 mucosal everting sutures were placed with Vicryl.  Right ureter spatulated again and final section set aside for pathology.    Heel stitch of 4-0 Vicryl was placed and a red color bander stent was placed 26 cm to the anastomosis and further ureteroenteric anastomosis was performed using 2 separate running suture lines of 4-0 Vicryl which resulted in excellent tension-free apposition.   Ureteroenteric anastomosis was performed on the left ureter on the opposite side of the conduit in a similar fashion by placing a blue color bander stent 26 to the anastomosis.  The right stents and left stents were separately tagged to the mid portion of  the conduit using purposeful air knot chromic to prevent inadvertent early displacement.  Next, a quarter-sized diameter column of skin and subcutaneous tissue was excised.  The previously marked stomal site was corresponded to the 15 mm port site.  The  fascia was dilated to accommodate 2 surgeon's fingers.  Four fascial anchoring sutures were placed in a quadrant fashion. Conduit was brought through this and the anchoring sutures were applied to the area of the proximal conduit as were 4 rosebudding  sutures in a quadrant fashion.  Abdomen was once again inspected via the extraction site.  Hemostasis was excellent and needle counts were correct.  Omentum was brought over this.  Extraction site was reapproximated at the level of fascia using  figure-of-eight PDS x 6 followed by reapproximation of Scarpa's with running Vicryl.  All incision sites were infiltrated with dilute lipolyzed Marcaine and closed at the level of the skin using subcuticular Monocryl followed by Dermabond.  Stomal  appliance was placed and after that, further conduit to skin apposition was performed using interrupted Vicryl in a quadrant fashion x 3 each quadrant.  We achieved the goals of the surgery today.  The patient was taken to postanesthesia care unit in  stable condition.  Plan for progressive care admission.  Please note, first assistant, Debbrah Alar, was crucial for all portions of the surgery today.  She  provided invaluable retraction, suctioning, vascular clipping, vascular stapling, robotic instrument exchange and general first assistance.   MUK D: 11/04/2022 3:23:11 pm T: 11/04/2022 9:30:00 pm  JOB: [id number]

## 2022-11-04 NOTE — Transfer of Care (Signed)
Immediate Anesthesia Transfer of Care Note  Patient: Joel Adkins  Procedure(s) Performed: XI ROBOTIC ASSISTED LAPAROSCOPIC COMPLETE CYSTECT ILEAL CONDUIT (Abdomen) CYSTOSCOPY WITH INJECTION OF INDOCYANINE GREEN DYE urethral dilation (Bladder) PELVIC LYMPH NODE DISSECTION (Bilateral: Abdomen)  Patient Location: PACU  Anesthesia Type:General  Level of Consciousness: awake and alert   Airway & Oxygen Therapy: Patient Spontanous Breathing and Patient connected to face mask oxygen  Post-op Assessment: Report given to RN and Post -op Vital signs reviewed and stable  Post vital signs: Reviewed and stable  Last Vitals:  Vitals Value Taken Time  BP 134/63 11/04/22 1401  Temp 36.6 C 11/04/22 1357  Pulse 83 11/04/22 1406  Resp    SpO2 92 % 11/04/22 1406  Vitals shown include unvalidated device data.  Last Pain:  Vitals:   11/04/22 0751  TempSrc:   PainSc: 0-No pain         Complications: No notable events documented.

## 2022-11-04 NOTE — Brief Op Note (Signed)
11/04/2022  1:49 PM  PATIENT:  Joel Adkins  82 y.o. male  PRE-OPERATIVE DIAGNOSIS:  BLADDER CANCER, MASSIVE PROSTATE  POST-OPERATIVE DIAGNOSIS:  bladder cancer, massive prostate, urethral stricture  PROCEDURE:  Procedure(s): XI ROBOTIC ASSISTED LAPAROSCOPIC COMPLETE CYSTECT ILEAL CONDUIT (N/A) CYSTOSCOPY WITH INJECTION OF INDOCYANINE GREEN DYE urethral dilation (N/A) PELVIC LYMPH NODE DISSECTION (Bilateral)  SURGEON:  Surgeon(s) and Role:    Alexis Frock, MD - Primary  PHYSICIAN ASSISTANT:   ASSISTANTS: Clemetine Marker PA   ANESTHESIA:   local and general  EBL:  200 mL   BLOOD ADMINISTERED:none  DRAINS: none   LOCAL MEDICATIONS USED:  NONE  SPECIMEN:  Source of Specimen:  1 - ureteral margins; 2- pelvic lymph nodes; 3 - bladder + prostate en bloc  DISPOSITION OF SPECIMEN:  PATHOLOGY  COUNTS:  YES  TOURNIQUET:  * No tourniquets in log *  DICTATION: .Other Dictation: Dictation Number [id number]  PLAN OF CARE: Admit to inpatient   PATIENT DISPOSITION:  PACU - hemodynamically stable.   Delay start of Pharmacological VTE agent (>24hrs) due to surgical blood loss or risk of bleeding: yes

## 2022-11-04 NOTE — Plan of Care (Signed)

## 2022-11-04 NOTE — Consult Note (Signed)
WOC consult requested for stoma site marking.  This was already performed on 3/4 in the outpatient ostomy clinic; refer to progress notes.  Hooks team will follow post-op for teaching sessions. Thank-you,  Julien Girt MSN, [place], [place], [place], [place]

## 2022-11-05 ENCOUNTER — Encounter (HOSPITAL_COMMUNITY): Payer: Self-pay | Admitting: Urology

## 2022-11-05 LAB — BASIC METABOLIC PANEL
Anion gap: 8 (ref 5–15)
BUN: 17 mg/dL (ref 8–23)
CO2: 21 mmol/L — ABNORMAL LOW (ref 22–32)
Calcium: 7.7 mg/dL — ABNORMAL LOW (ref 8.9–10.3)
Chloride: 102 mmol/L (ref 98–111)
Creatinine, Ser: 0.96 mg/dL (ref 0.61–1.24)
GFR, Estimated: >60 mL/min (ref >60)
Glucose, Bld: 112 mg/dL — ABNORMAL HIGH (ref 70–99)
Potassium: 4.3 mmol/L (ref 3.5–5.1)
Sodium: 131 mmol/L — ABNORMAL LOW (ref 135–145)

## 2022-11-05 LAB — HEMOGLOBIN AND HEMATOCRIT, BLOOD
HCT: 41.4 % (ref 39.0–52.0)
Hemoglobin: 12.2 g/dL — ABNORMAL LOW (ref 13.0–17.0)

## 2022-11-05 MED ORDER — CHLORHEXIDINE GLUCONATE CLOTH 2 % EX PADS
6.0000 | MEDICATED_PAD | Freq: Every day | CUTANEOUS | Status: DC
  Administered 2022-11-06 – 2022-11-09 (×4): 6 via TOPICAL

## 2022-11-05 NOTE — Consult Note (Addendum)
[place] Nurse ostomy follow up Pt states he only speaks [place]; staff member at the bedside to translate. Confirmed with patient that his wife has requested a teaching session tomorrow at 1000 and he agrees.  He states that she speaks [place] and I will not need a Optometrist.  Urostomy pouch intact with good seal, stoma is red and viable when visualized through the pouch, 2 stints intact. Mod amt pink urine in bedside drainage bag.  I will perform the initial pouch change demonstration and teaching session tomorrow at 1000 as requested. 7 sets of supplies left in the room, along with educational materials.  Use supplies: barrier ring, Lawson # [id number], and one piece flat pouch Lawson #3. Placed on Secure Start: Not yet Thank-you,  Julien Girt MSN, [place], [place], [place], [place]

## 2022-11-05 NOTE — Progress Notes (Signed)
1 Day Post-Op   Subjective/Chief Complaint:  1 - Bladder Cancer - s/p robotic cystoprostatectomy with ICG sentinal + template node dissection and conduit diversion 11/04/22, path pending. Admitted day prior for stomal marking + bowel prep.  2 - Post-Op Ileus - s/p planned bowel anastomosis as part of surgery on 11/04/22. NPO initially, ice chips POD 1.   3 - Disposition / Rehab - independent at baseline in all ADL's. PT eval pending.  Today "Joel Adkins" is stable. NO emesis or high grade fevers.    Objective: Vital signs in last 24 hours: Temp:  [97.9 F (36.6 C)-99 F (37.2 C)] 98.7 F (37.1 C) (03/07 0425) Pulse Rate:  [76-93] 76 (03/07 0425) Resp:  [16-20] 20 (03/07 0425) BP: (111-134)/(59-73) 114/67 (03/07 0425) SpO2:  [90 %-100 %] 96 % (03/07 0425) Last BM Date : 11/03/22  Intake/Output from previous day: 03/06 0701 - 03/07 0700 In: 2894.9 [I.V.:2494.9; IV Piggyback:400] Out: 1215 [Urine:600; Drains:415; Blood:200] Intake/Output this shift: No intake/output data recorded.  NAD, AOx3, pleasant. Interviewed with Spanish video interpreter. Non-labored breathing on RA. RRR SNTND. RLQ Urostomy pink/patent with Rt (red) and Lt (blue) bander stents and copious non-foul urine.  SCD's in place.   Lab Results:  Recent Labs    11/03/22 1617 11/04/22 1610 11/05/22 0419  WBC 4.2  --   --   HGB 13.1 12.9* 12.2*  HCT 41.6 42.7 41.4  PLT 236  --   --    BMET Recent Labs    11/03/22 1617 11/05/22 0419  NA 129* 131*  K 4.2 4.3  CL 101 102  CO2 23 21*  GLUCOSE 99 112*  BUN 16 17  CREATININE 0.94 0.96  CALCIUM 8.1* 7.7*   PT/INR No results for input(s): "LABPROT", "INR" in the last 72 hours. ABG No results for input(s): "PHART", "HCO3" in the last 72 hours.  Invalid input(s): "PCO2", "PO2"  Studies/Results: No results found.  Anti-infectives: Anti-infectives (From admission, onward)    Start     Dose/Rate Route Frequency Ordered Stop   11/04/22 1800   piperacillin-tazobactam (ZOSYN) IVPB 3.375 g        3.375 g 12.5 mL/hr over 240 Minutes Intravenous Every 8 hours 11/04/22 1524 11/05/22 1759   11/04/22 0817  piperacillin-tazobactam (ZOSYN) 3.375 GM/50ML IVPB  Status:  Discontinued       Note to Pharmacy: Timoteo Expose: cabinet override      11/04/22 0817 11/04/22 0822   11/04/22 0600  piperacillin-tazobactam (ZOSYN) IVPB 3.375 g        3.375 g 100 mL/hr over 30 Minutes Intravenous 30 min pre-op 11/03/22 1603 11/04/22 0929   11/03/22 1715  neomycin (MYCIFRADIN) tablet 500 mg        500 mg Oral Every 4 hours 11/03/22 1627 11/03/22 2012   11/03/22 1715  metroNIDAZOLE (FLAGYL) tablet 500 mg        500 mg Oral Every 4 hours 11/03/22 1627 11/03/22 2011       Assessment/Plan:  PT eval, ambulate. Ice chips today. Goals for DC breifly overviewed.    Alexis Frock 11/05/2022

## 2022-11-05 NOTE — Progress Notes (Signed)
Mobility Specialist - Progress Note   11/05/22 0919  Mobility  Activity Ambulated with assistance in hallway  Level of Assistance Contact guard assist, steadying assist  Assistive Device Front wheel walker  Distance Ambulated (ft) 100 ft  Range of Motion/Exercises Active  Activity Response Tolerated well  $Mobility charge 1 Mobility   Pt was found in bed and agreeable to ambulate. Stated feeling good this morning and during ambulation did state having some pain from his incision site. At EOS returned to bed with all necessities in reach and RN notified of session.  Ferd Hibbs Mobility Specialist

## 2022-11-05 NOTE — Plan of Care (Signed)

## 2022-11-06 LAB — HEMOGLOBIN AND HEMATOCRIT, BLOOD
HCT: 41.6 % (ref 39.0–52.0)
Hemoglobin: 12.3 g/dL — ABNORMAL LOW (ref 13.0–17.0)

## 2022-11-06 LAB — BASIC METABOLIC PANEL
Anion gap: 9 (ref 5–15)
BUN: 14 mg/dL (ref 8–23)
CO2: 19 mmol/L — ABNORMAL LOW (ref 22–32)
Calcium: 7.6 mg/dL — ABNORMAL LOW (ref 8.9–10.3)
Chloride: 102 mmol/L (ref 98–111)
Creatinine, Ser: 0.74 mg/dL (ref 0.61–1.24)
GFR, Estimated: >60 mL/min (ref >60)
Glucose, Bld: 74 mg/dL (ref 70–99)
Potassium: 4.2 mmol/L (ref 3.5–5.1)
Sodium: 130 mmol/L — ABNORMAL LOW (ref 135–145)

## 2022-11-06 NOTE — TOC Initial Note (Signed)
Transition of Care [facility]) - Initial/Assessment Note    Patient Details  Name: Joel Adkins MRN: [id number] Date of Birth: 1940-10-04  Transition of Care [facility]) CM/SW Contact:    Illene Regulus, LCSW Phone Number: 11/06/2022, 11:18 AM  Clinical Narrative:                 CSW spoke with pt and family who provided translation. CSW informed pt and family about [facility] recommendations, they are in agreement. Pt does not have a preference in agency , just that they speak spanish. [facility] was able to accept pt for [facility] RN. TOC to follow.   Expected Discharge Plan: [place] Barriers to Discharge: Continued Medical Work up   Patient Goals and CMS Choice Patient states their goals for this hospitalization and ongoing recovery are:: retrun home CMS Medicare.gov Compare Post Acute Care list provided to:: Patient Choice offered to / list presented to : Patient      Expected Discharge Plan and Services       Living arrangements for the past 2 months: Single Family Home                     Date DME Agency Contacted: 11/06/22 Time DME Agency Contacted: 51 Representative spoke with at DME Agency: kelly HH Arranged: RN [place] Agency: [place] Date [place]: 11/06/22 Time [place]: 51 Representative spoke with at [place]: [place] Arrangements/Services Living arrangements for the past 2 months: [place] with:: Self, Spouse Patient language and need for interpreter reviewed:: Yes Do you feel safe going back to the place where you live?: Yes      Need for Family Participation in Patient Care: No (Comment) Care giver support system in place?: No (comment)   Criminal Activity/Legal Involvement Pertinent to Current Situation/Hospitalization: No - Comment as needed  Activities of Daily Living Home Assistive Devices/Equipment: Walker (specify type) ADL Screening (condition at time of  admission) Patient's cognitive ability adequate to safely complete daily activities?: Yes Is the patient deaf or have difficulty hearing?: No Does the patient have difficulty seeing, even when wearing glasses/contacts?: No Does the patient have difficulty concentrating, remembering, or making decisions?: No Patient able to express need for assistance with ADLs?: Yes Does the patient have difficulty dressing or bathing?: No Independently performs ADLs?: Yes (appropriate for developmental age) Does the patient have difficulty walking or climbing stairs?: No Weakness of Legs: None Weakness of Arms/Hands: None  Permission Sought/Granted                  Emotional Assessment Appearance:: Appears stated age     Orientation: : Oriented to Self, Oriented to Place, Oriented to  Time, Oriented to Situation   Psych Involvement: No (comment)  Admission diagnosis:  Bladder cancer [facility]) [C67.9] Patient Active Problem List   Diagnosis Date Noted   Encounter for ostomy care education 11/03/2022   Encounter for health education 11/03/2022   Bladder cancer ([place]) 09/01/2022   Malignant neoplasm of overlapping sites of bladder ([place]) 08/27/2022   Hypothyroidism 05/28/2022   Dyslipidemia 04/06/2019   Benign prostatic hyperplasia without lower urinary tract symptoms 04/06/2019   Elevated blood pressure reading 04/06/2019   PCP:  Horald Pollen, MD Pharmacy:   [id number] [id number]: [phone]: [phone]    Social Determinants of Health (SDOH) Social History: SDOH  Screenings   Food Insecurity: No Food Insecurity (11/03/2022)  Housing: Low Risk  (11/03/2022)  Transportation Needs: No Transportation Needs (11/03/2022)  Utilities: Not At Risk (11/03/2022)  Depression (PHQ2-9): Low Risk  (08/27/2022)  Tobacco Use: Medium Risk (11/05/2022)   SDOH Interventions:     Readmission Risk Interventions      No data to display

## 2022-11-06 NOTE — Evaluation (Signed)
Physical Therapy Evaluation Patient Details Name: Joel Adkins MRN: [id number] DOB: 08/15/1941 Today's Date: 11/06/2022  History of Present Illness  Pt is an 82 y.o. male with bladder cancer s/p Robotic-assisted laparoscopic radical cystectomy and prostatectomy, Bilateral pelvic lymph node dissection, and Ileal conduit diversion.  Admitted day prior for stomal marking + bowel prep. PMH significant for HLD, hypothyroidism, pre-diabetes,  and L THA (2018) .   Clinical Impression  Pt is an 82 y.o. male with above HPI resulting in the deficits listed below (see PT Problem List). Pt lives with spouse and is typically independent with ADLs and mobility at baseline without use of AD. Pt performed sit to stand transfers with supervision for safety. Pt ambulated total of ~70f with use of RW and supervision for safety. Trialed ~330fwithout use of AD and pt requiring MIN guard and with intermittent furniture reaching for stability. Pt, family, and RN encouraged for ambulation multiple times/day with supervision and use of RW. Pt will benefit from skilled PT to maximize functional mobility to increase independence.         Recommendations for follow up therapy are one component of a multi-disciplinary discharge planning process, led by the attending physician.  Recommendations may be updated based on patient status, additional functional criteria and insurance authorization.  Follow Up Recommendations Home health PT      Assistance Recommended at Discharge Intermittent Supervision/Assistance  Patient can return home with the following  A little help with walking and/or transfers;Help with stairs or ramp for entrance;Assistance with cooking/housework;A lot of help with bathing/dressing/bathroom    Equipment Recommendations None recommended by PT (pt owns RW)  Recommendations for Other Services  OT consult    Functional Status Assessment Patient has had a recent decline in their functional status  and demonstrates the ability to make significant improvements in function in a reasonable and predictable amount of time.     Precautions / Restrictions Precautions Precautions: Fall Restrictions Weight Bearing Restrictions: No      Mobility  Bed Mobility Overal bed mobility: Needs Assistance Bed Mobility: Supine to Sit     Supine to sit: Supervision     General bed mobility comments: education on use of log roll for comfort    Transfers Overall transfer level: Needs assistance Equipment used: None Transfers: Sit to/from Stand Sit to Stand: Supervision                Ambulation/Gait Ambulation/Gait assistance: Supervision Gait Distance (Feet): 90 Feet Assistive device: Rolling walker (2 wheels) Gait Pattern/deviations: Step-to pattern, Decreased stride length, Trunk flexed (mild trunk flexion due to pain) Gait velocity: decreased     General Gait Details: no overt LOB observed, very slow speed. Trialed ~3088fithout use of AD and pt with intermittent furniture reaching and CGA  Stairs            Wheelchair Mobility    Modified Rankin (Stroke Patients Only)       Balance Overall balance assessment: Needs assistance Sitting-balance support: Feet supported Sitting balance-Leahy Scale: Good     Standing balance support: Bilateral upper extremity supported, No upper extremity supported, During functional activity Standing balance-Leahy Scale: Good                               Pertinent Vitals/Pain Pain Assessment Pain Assessment: Faces Faces Pain Scale: Hurts little more Pain Location: abdomen Pain Descriptors / Indicators: Sore Pain Intervention(s): Monitored during session  Home Living Family/patient expects to be discharged to:: Private residence Living Arrangements: Spouse/significant other Available Help at Discharge: Family Type of Home: Apartment (1st floor) Home Access: Level entry       Home Layout: One  level Home Equipment: Conservation officer, nature (2 wheels)      Prior Function Prior Level of Function : Independent/Modified Independent             Mobility Comments: no AD use at baseline ADLs Comments: reports indepdnent wih ADLs, wife does cooking and pt cleans dishes     Hand Dominance        Extremity/Trunk Assessment   Upper Extremity Assessment Upper Extremity Assessment: Defer to OT evaluation    Lower Extremity Assessment Lower Extremity Assessment: Generalized weakness    Cervical / Trunk Assessment Cervical / Trunk Assessment: Normal  Communication   Communication: Prefers language other than English (spanish)  Cognition Arousal/Alertness: Awake/alert Behavior During Therapy: WFL for tasks assessed/performed Overall Cognitive Status: Within Functional Limits for tasks assessed                                 General Comments: Pt's family present during session to provide translation (son)        General Comments      Exercises     Assessment/Plan    PT Assessment Patient needs continued PT services  PT Problem List Decreased strength;Decreased activity tolerance;Decreased balance;Decreased mobility;Pain       PT Treatment Interventions DME instruction;Gait training;Functional mobility training;Therapeutic activities;Therapeutic exercise;Balance training;Patient/family education    PT Goals (Current goals can be found in the Care Plan section)  Acute Rehab PT Goals Patient Stated Goal: Feel better and keep moving PT Goal Formulation: With patient/family Time For Goal Achievement: 11/20/22 Potential to Achieve Goals: Good    Frequency Min 3X/week     Co-evaluation               AM-PAC PT "6 Clicks" Mobility  Outcome Measure Help needed turning from your back to your side while in a flat bed without using bedrails?: A Little Help needed moving from lying on your back to sitting on the side of a flat bed without using  bedrails?: A Little Help needed moving to and from a bed to a chair (including a wheelchair)?: A Little Help needed standing up from a chair using your arms (e.g., wheelchair or bedside chair)?: A Little Help needed to walk in hospital room?: A Little Help needed climbing 3-5 steps with a railing? : A Lot 6 Click Score: 17    End of Session Equipment Utilized During Treatment: Gait belt Activity Tolerance: Patient tolerated treatment well Patient left: with nursing/sitter in room;with family/visitor present (in bathroom with nurse tech) Nurse Communication: Mobility status PT Visit Diagnosis: Unsteadiness on feet (R26.81);Muscle weakness (generalized) (M62.81);Pain Pain - Right/Left:  (mid) Pain - part of body:  (abdomen)    Time: [id number] PT Time Calculation (min) (ACUTE ONLY): 17 min   Charges:   PT Evaluation $PT Eval Low Complexity: 1 Low          Festus Barren PT, DPT  Acute Rehabilitation Services  Office [phone] 11/06/2022, 11:54 AM

## 2022-11-06 NOTE — Progress Notes (Signed)
2 Days Post-Op   Subjective/Chief Complaint:  1 - Bladder Cancer - s/p robotic cystoprostatectomy with ICG sentinal + template node dissection and conduit diversion 11/04/22, path pending. Admitted day prior for stomal marking + bowel prep.  2 - Post-Op Ileus - s/p planned bowel anastomosis as part of surgery on 11/04/22. NPO initially, ice chips POD 1. Clears POD 2, some small BM and gas POD 2.   3 - Disposition / Rehab - independent at baseline in all ADL's. PT recs HHPT.   Today "Joel Adkins" is stable. NO emesis or high grade fevers. Started clears today. PT eval does rec HHPT. Passing gas and some small BM earlier.    Objective: Vital signs in last 24 hours: Temp:  [98 F (36.7 C)-98.5 F (36.9 C)] 98 F (36.7 C) (03/08 1237) Pulse Rate:  [84-94] 94 (03/08 1237) Resp:  [17-18] 18 (03/08 1237) BP: (126-140)/(64-75) 140/75 (03/08 1237) SpO2:  [93 %-95 %] 93 % (03/08 1237) Last BM Date : 11/06/22  Intake/Output from previous day: 03/07 0701 - 03/08 0700 In: 1553.9 [P.O.:260; I.V.:1293.9] Out: 1585 [Urine:1450; Drains:135] Intake/Output this shift: Total I/O In: -  Out: 465 [Urine:375; Drains:90]  NAD, AOx3, pleasant. Interviewed with son on speaker phone and his RN who is fluent in [place].  Non-labored breathing on RA. Ambulating with walker. RRR SNTND. RLQ Urostomy pink/patent with Rt (red) and Lt (blue) bander stents and copious non-foul urine.  SCD's in place.   Lab Results:  Recent Labs    11/03/22 1617 11/04/22 1610 11/05/22 0419 11/06/22 0419  WBC 4.2  --   --   --   HGB 13.1   < > 12.2* 12.3*  HCT 41.6   < > 41.4 41.6  PLT 236  --   --   --    < > = values in this interval not displayed.   BMET Recent Labs    11/05/22 0419 11/06/22 0419  NA 131* 130*  K 4.3 4.2  CL 102 102  CO2 21* 19*  GLUCOSE 112* 74  BUN 17 14  CREATININE 0.96 0.74  CALCIUM 7.7* 7.6*   PT/INR No results for input(s): "LABPROT", "INR" in the last 72 hours. ABG No results  for input(s): "PHART", "HCO3" in the last 72 hours.  Invalid input(s): "PCO2", "PO2"  Studies/Results: No results found.  Anti-infectives: Anti-infectives (From admission, onward)    Start     Dose/Rate Route Frequency Ordered Stop   11/04/22 1800  piperacillin-tazobactam (ZOSYN) IVPB 3.375 g        3.375 g 12.5 mL/hr over 240 Minutes Intravenous Every 8 hours 11/04/22 1524 11/05/22 1407   11/04/22 0817  piperacillin-tazobactam (ZOSYN) 3.375 GM/50ML IVPB  Status:  Discontinued       Note to Pharmacy: [place] Expose: cabinet override      11/04/22 0817 11/04/22 0822   11/04/22 0600  piperacillin-tazobactam (ZOSYN) IVPB 3.375 g        3.375 g 100 mL/hr over 30 Minutes Intravenous 30 min pre-op 11/03/22 1603 11/04/22 0929   11/03/22 1715  neomycin (MYCIFRADIN) tablet 500 mg        500 mg Oral Every 4 hours 11/03/22 1627 11/03/22 2012   11/03/22 1715  metroNIDAZOLE (FLAGYL) tablet 500 mg        500 mg Oral Every 4 hours 11/03/22 1627 11/03/22 2011       Assessment/Plan:  Doing exceptionally well POD 2 s/p major surgery. Clear today, likely fulls tomorrow based on current progress. Updated family  and NSG on plan as well. Appreciate ostomy RN, PT, Case management teams.    Joel Adkins 11/06/2022

## 2022-11-06 NOTE — Consult Note (Addendum)
[facility] Nurse ostomy consult note Ostomy teaching performed with son present to translate. Wife performed pouching steps with assistance. Pt watched using a hand held mirror.  Stoma type/location: Stoma is red and viable, 1 1/2 inchs, above skin level.  2 stints in place, mod amt pink urine Education provided:  Wife was able to stretch barrier ring and apply to back of pouch, and apply pouch to skin.  Pt and wife were able to open and close spout to empty, and connect and disconnect bedside drainage bag. Discussed ordering supplies and pouching routines. Use Supplies: barrier ring, Kellie Simmering # [id number], and Roselee Culver # 3 Enrolled patient in [place] program: Yes, today. 5 sets of supplies left in the room, along with educational materials. [facility] team will perform another teaching session on Mon.   Thank-you,  Julien Girt MSN, [place], [place], [place], [place]

## 2022-11-07 LAB — HEMOGLOBIN AND HEMATOCRIT, BLOOD
HCT: 42 % (ref 39.0–52.0)
Hemoglobin: 13.4 g/dL (ref 13.0–17.0)

## 2022-11-07 LAB — BASIC METABOLIC PANEL
Anion gap: 8 (ref 5–15)
BUN: 17 mg/dL (ref 8–23)
CO2: 23 mmol/L (ref 22–32)
Calcium: 7.8 mg/dL — ABNORMAL LOW (ref 8.9–10.3)
Chloride: 99 mmol/L (ref 98–111)
Creatinine, Ser: 0.89 mg/dL (ref 0.61–1.24)
GFR, Estimated: >60 mL/min (ref >60)
Glucose, Bld: 158 mg/dL — ABNORMAL HIGH (ref 70–99)
Potassium: 4.2 mmol/L (ref 3.5–5.1)
Sodium: 130 mmol/L — ABNORMAL LOW (ref 135–145)

## 2022-11-07 NOTE — Progress Notes (Signed)
3 Days Post-Op   Subjective/Chief Complaint:  1 - Bladder Cancer - s/p robotic cystoprostatectomy with ICG sentinal + template node dissection and conduit diversion 11/04/22, path pending. Admitted day prior for stomal marking + bowel prep.  2 - Post-Op Ileus - s/p planned bowel anastomosis as part of surgery on 11/04/22. NPO initially, ice chips POD 1. Clears POD 2, some small BM and gas POD 2. Full Liquids POD3  3 - Disposition / Rehab - independent at baseline in all ADL's. PT recs HHPT.   Today "Joel Adkins" is stable. Denies nausea or emesis. Passing flatus. Making adequate urine output. Hgb 13.4. Advanced to full liquids this morning.   Objective: Vital signs in last 24 hours: Temp:  [98 F (36.7 C)-99.1 F (37.3 C)] 98.4 F (36.9 C) (03/09 0525) Pulse Rate:  [94-97] 96 (03/09 0525) Resp:  [18] 18 (03/09 0525) BP: (140-163)/(75-84) 145/84 (03/09 0525) SpO2:  [93 %-95 %] 95 % (03/09 0525) Last BM Date : 11/06/22  Intake/Output from previous day: 03/08 0701 - 03/09 0700 In: 1664.8 [P.O.:600; I.V.:1064.8] Out: 1115 [Urine:775; Drains:340] Intake/Output this shift: Total I/O In: -  Out: 70 [Drains:70]  NAD, AOx3, pleasant. Interviewed with son on speaker phone and his RN who is fluent in [place].  Non-labored breathing on RA. Ambulating with walker. Regular rate SNTND. RLQ Urostomy pink/patent with Rt (red) and Lt (blue) bander stents and copious non-foul urine.  SCD's in place.   Lab Results:  Recent Labs    11/06/22 0419 11/07/22 0454  HGB 12.3* 13.4  HCT 41.6 42.0    BMET Recent Labs    11/06/22 0419 11/07/22 0454  NA 130* 130*  K 4.2 4.2  CL 102 99  CO2 19* 23  GLUCOSE 74 158*  BUN 14 17  CREATININE 0.74 0.89  CALCIUM 7.6* 7.8*    PT/INR No results for input(s): "LABPROT", "INR" in the last 72 hours. ABG No results for input(s): "PHART", "HCO3" in the last 72 hours.  Invalid input(s): "PCO2", "PO2"  Studies/Results: No results  found.  Anti-infectives: Anti-infectives (From admission, onward)    Start     Dose/Rate Route Frequency Ordered Stop   11/04/22 1800  piperacillin-tazobactam (ZOSYN) IVPB 3.375 g        3.375 g 12.5 mL/hr over 240 Minutes Intravenous Every 8 hours 11/04/22 1524 11/05/22 1407   11/04/22 0817  piperacillin-tazobactam (ZOSYN) 3.375 GM/50ML IVPB  Status:  Discontinued       Note to Pharmacy: [place]o Expose: cabinet override      11/04/22 0817 11/04/22 0822   11/04/22 0600  piperacillin-tazobactam (ZOSYN) IVPB 3.375 g        3.375 g 100 mL/hr over 30 Minutes Intravenous 30 min pre-op 11/03/22 1603 11/04/22 0929   11/03/22 1715  neomycin (MYCIFRADIN) tablet 500 mg        500 mg Oral Every 4 hours 11/03/22 1627 11/03/22 2012   11/03/22 1715  metroNIDAZOLE (FLAGYL) tablet 500 mg        500 mg Oral Every 4 hours 11/03/22 1627 11/03/22 2011       Assessment/Plan:  Continues to do well, POD3 s/p major surgery. Advanced to full liquids today. Updated family on plan as well. Appreciate ostomy RN, PT, Case management teams.    Luisenrique Conran Allysson Rinehimer 11/07/2022

## 2022-11-07 NOTE — Evaluation (Signed)
Occupational Therapy Evaluation Patient Details Name: Joel Adkins MRN: [id number] DOB: Jun 03, 1941 Today's Date: 11/07/2022   History of Present Illness Pt is an 82 y.o. male with bladder cancer s/p Robotic-assisted laparoscopic radical cystectomy and prostatectomy, Bilateral pelvic lymph node dissection, and Ileal conduit diversion.  Admitted day prior for stomal marking + bowel prep. PMH significant for HLD, hypothyroidism, pre-diabetes,  and L THA (2018) .   Clinical Impression   Joel Adkins is an 82 year old man who presents s/p abdominal surgery with pain. On evaluation he was able to perform bed transfers, ambulate in hall and demonstrate ability to perform ADLs. He reports independence with toileting yesterday and ability to don socks. He moves slowly and needs altered positioning due to pain but did not require any physical assistance. He has a wife at home to assist him if needed. Patient has no acute care OT needs. Recommend he ambulates often with nursing.       Recommendations for follow up therapy are one component of a multi-disciplinary discharge planning process, led by the attending physician.  Recommendations may be updated based on patient status, additional functional criteria and insurance authorization.   Follow Up Recommendations  No OT follow up     Assistance Recommended at Discharge Intermittent Supervision/Assistance  Patient can return home with the following Assistance with cooking/housework    Functional Status Assessment  Patient has had a recent decline in their functional status and demonstrates the ability to make significant improvements in function in a reasonable and predictable amount of time.  Equipment Recommendations  None recommended by OT    Recommendations for Other Services       Precautions / Restrictions Precautions Precautions: Fall Precaution Comments: ostomy, left side drain Restrictions Weight Bearing Restrictions: No       Mobility Bed Mobility Overal bed mobility: Modified Independent             General bed mobility comments: increased time    Transfers Overall transfer level: Needs assistance Equipment used: Rolling walker (2 wheels) Transfers: Sit to/from Stand             General transfer comment: Used walker to ambulate in hall. Slow gait but no unsteadiness.      Balance Overall balance assessment: Mild deficits observed, not formally tested                                         ADL either performed or assessed with clinical judgement   ADL Overall ADL's : Modified independent                                       General ADL Comments: Needs increased time and altered positioning but predominantly independent. Has pain with movement.     Vision Patient Visual Report: No change from baseline       Perception     Praxis      Pertinent Vitals/Pain Pain Assessment Pain Assessment: Faces Faces Pain Scale: Hurts little more Pain Location: abdomen - with movement Pain Descriptors / Indicators: Sore Pain Intervention(s): Monitored during session     Hand Dominance     Extremity/Trunk Assessment Upper Extremity Assessment Upper Extremity Assessment: Overall WFL for tasks assessed   Lower Extremity Assessment Lower Extremity Assessment: Defer to PT evaluation  Cervical / Trunk Assessment Cervical / Trunk Assessment: Normal   Communication Communication Communication: Prefers language other than English (spanish)   Cognition Arousal/Alertness: Awake/alert Behavior During Therapy: WFL for tasks assessed/performed Overall Cognitive Status: Within Functional Limits for tasks assessed                                       General Comments       Exercises     Shoulder Instructions      Home Living Family/patient expects to be discharged to:: Private residence Living Arrangements: Spouse/significant  other Available Help at Discharge: Family Type of Home: Apartment (1st floor) Home Access: Level entry     Home Layout: One level     Bathroom Shower/Tub: Teacher, early years/pre: Standard     Home Equipment: Conservation officer, nature (2 wheels)          Prior Functioning/Environment Prior Level of Function : Independent/Modified Independent             Mobility Comments: no AD use at baseline ADLs Comments: reports independent wih ADLs, wife does cooking and pt cleans dishes        OT Problem List: Pain;Decreased activity tolerance      OT Treatment/Interventions:      OT Goals(Current goals can be found in the care plan section) Acute Rehab OT Goals OT Goal Formulation: All assessment and education complete, DC therapy  OT Frequency:      Co-evaluation              AM-PAC OT "6 Clicks" Daily Activity     Outcome Measure Help from another person eating meals?: None Help from another person taking care of personal grooming?: None Help from another person toileting, which includes using toliet, bedpan, or urinal?: None Help from another person bathing (including washing, rinsing, drying)?: None Help from another person to put on and taking off regular upper body clothing?: None Help from another person to put on and taking off regular lower body clothing?: None 6 Click Score: 24   End of Session Equipment Utilized During Treatment: Rolling walker (2 wheels) Nurse Communication: Mobility status  Activity Tolerance: Patient tolerated treatment well Patient left: in bed;with call bell/phone within reach;with bed alarm set  OT Visit Diagnosis: Pain                Time: [id number] OT Time Calculation (min): 14 min Charges:  OT General Charges $OT Visit: 1 Visit OT Evaluation $OT Eval Low Complexity: 1 Low  Joel Adkins, OTR/L [place]  Office [phone]   Joel Adkins 11/07/2022, 12:00 PM

## 2022-11-08 LAB — BASIC METABOLIC PANEL
Anion gap: 8 (ref 5–15)
BUN: 18 mg/dL (ref 8–23)
CO2: 24 mmol/L (ref 22–32)
Calcium: 8.1 mg/dL — ABNORMAL LOW (ref 8.9–10.3)
Chloride: 101 mmol/L (ref 98–111)
Creatinine, Ser: 0.82 mg/dL (ref 0.61–1.24)
GFR, Estimated: >60 mL/min (ref >60)
Glucose, Bld: 134 mg/dL — ABNORMAL HIGH (ref 70–99)
Potassium: 4.1 mmol/L (ref 3.5–5.1)
Sodium: 133 mmol/L — ABNORMAL LOW (ref 135–145)

## 2022-11-08 LAB — HEMOGLOBIN AND HEMATOCRIT, BLOOD
HCT: 39.1 % (ref 39.0–52.0)
Hemoglobin: 12.7 g/dL — ABNORMAL LOW (ref 13.0–17.0)

## 2022-11-08 NOTE — Progress Notes (Signed)
4 Days Post-Op   Subjective/Chief Complaint:  1 - Bladder Cancer - s/p robotic cystoprostatectomy with ICG sentinal + template node dissection and conduit diversion 11/04/22, path pending. Admitted day prior for stomal marking + bowel prep.  2 - Post-Op Ileus - s/p planned bowel anastomosis as part of surgery on 11/04/22. NPO initially, ice chips POD 1. Clears POD 2, some small BM and gas POD 2. Full Liquids POD3. Regular diet POD4  3 - Disposition / Rehab - independent at baseline in all ADL's. PT recs HHPT.   Today "Joel Adkins" is stable. Denies nausea or emesis. Passing flatus. Making adequate urine output. 620cc from [place]. Hgb 12.7. Advanced to regular diet this morning.   Objective: Vital signs in last 24 hours: Temp:  [98.4 F (36.9 C)-98.6 F (37 C)] 98.4 F (36.9 C) (03/10 0458) Pulse Rate:  [98-100] 98 (03/10 0458) Resp:  [18-19] 18 (03/10 0458) BP: (135-150)/(75-79) 135/79 (03/10 0458) SpO2:  [89 %-93 %] 92 % (03/10 0515) Last BM Date : 11/06/22  Intake/Output from previous day: 03/09 0701 - 03/10 0700 In: 1799 [P.O.:1530; I.V.:269] Out: 1229 [Urine:600; Drains:629] Intake/Output this shift: Total I/O In: -  Out: 60 [Drains:60]  NAD, AOx3, pleasant. Interviewed with son on speaker phone and his RN who is fluent in [place].  Non-labored breathing on RA. Ambulating with walker. Regular rate SNTND. RLQ Urostomy pink/patent with Rt (red) and Lt (blue) bander stents and copious non-foul urine.  SCD's in place.   Lab Results:  Recent Labs    11/07/22 0454 11/08/22 0430  HGB 13.4 12.7*  HCT 42.0 39.1    BMET Recent Labs    11/07/22 0454 11/08/22 0720  NA 130* 133*  K 4.2 4.1  CL 99 101  CO2 23 24  GLUCOSE 158* 134*  BUN 17 18  CREATININE 0.89 0.82  CALCIUM 7.8* 8.1*    PT/INR No results for input(s): "LABPROT", "INR" in the last 72 hours. ABG No results for input(s): "PHART", "HCO3" in the last 72 hours.  Invalid input(s): "PCO2",  "PO2"  Studies/Results: No results found.  Anti-infectives: Anti-infectives (From admission, onward)    Start     Dose/Rate Route Frequency Ordered Stop   11/04/22 1800  piperacillin-tazobactam (ZOSYN) IVPB 3.375 g        3.375 g 12.5 mL/hr over 240 Minutes Intravenous Every 8 hours 11/04/22 1524 11/05/22 1407   11/04/22 0817  piperacillin-tazobactam (ZOSYN) 3.375 GM/50ML IVPB  Status:  Discontinued       Note to Pharmacy: Timoteo Expose: cabinet override      11/04/22 0817 11/04/22 0822   11/04/22 0600  piperacillin-tazobactam (ZOSYN) IVPB 3.375 g        3.375 g 100 mL/hr over 30 Minutes Intravenous 30 min pre-op 11/03/22 1603 11/04/22 0929   11/03/22 1715  neomycin (MYCIFRADIN) tablet 500 mg        500 mg Oral Every 4 hours 11/03/22 1627 11/03/22 2012   11/03/22 1715  metroNIDAZOLE (FLAGYL) tablet 500 mg        500 mg Oral Every 4 hours 11/03/22 1627 11/03/22 2011       Assessment/Plan:  Continues to do well, POD4 s/p major surgery. Advanced to regular diet today. Updated family on plan as well. Appreciate ostomy RN, PT, Case management teams.    Joel Adkins Joel Adkins 11/08/2022

## 2022-11-08 NOTE — Plan of Care (Signed)

## 2022-11-09 LAB — BASIC METABOLIC PANEL
Anion gap: 8 (ref 5–15)
BUN: 18 mg/dL (ref 8–23)
CO2: 24 mmol/L (ref 22–32)
Calcium: 8.1 mg/dL — ABNORMAL LOW (ref 8.9–10.3)
Chloride: 99 mmol/L (ref 98–111)
Creatinine, Ser: 0.67 mg/dL (ref 0.61–1.24)
GFR, Estimated: >60 mL/min (ref >60)
Glucose, Bld: 113 mg/dL — ABNORMAL HIGH (ref 70–99)
Potassium: 3.9 mmol/L (ref 3.5–5.1)
Sodium: 131 mmol/L — ABNORMAL LOW (ref 135–145)

## 2022-11-09 LAB — HEMOGLOBIN AND HEMATOCRIT, BLOOD
HCT: 38 % — ABNORMAL LOW (ref 39.0–52.0)
Hemoglobin: 12 g/dL — ABNORMAL LOW (ref 13.0–17.0)

## 2022-11-09 LAB — CREATININE, FLUID (PLEURAL, PERITONEAL, JP DRAINAGE): Creat, Fluid: 0.7 mg/dL

## 2022-11-09 LAB — SURGICAL PATHOLOGY

## 2022-11-09 NOTE — Progress Notes (Signed)
Physical Therapy Treatment Patient Details Name: Joel Adkins MRN: [id number] DOB: 1940-11-19 Today's Date: 11/09/2022   History of Present Illness Pt is an 82 y.o. male with bladder cancer s/p Robotic-assisted laparoscopic radical cystectomy and prostatectomy, Bilateral pelvic lymph node dissection, and Ileal conduit diversion.  Admitted day prior for stomal marking + bowel prep. PMH significant for HLD, hypothyroidism, pre-diabetes,  and L THA (2018) .    PT Comments    General Comments: AxO x 3 very pleasant and willing Spanish speaking gentleman.  Family present to assist with laguage.  Pt progressing well with his mobility.  Assisted with amb in hallway went well.  General Gait Details: amb without any AD this session.  Tolerated a great functional distance.  Short steps but alternating and decreased speed but functional. Pt plans to return home with family support.   Recommendations for follow up therapy are one component of a multi-disciplinary discharge planning process, led by the attending physician.  Recommendations may be updated based on patient status, additional functional criteria and insurance authorization.  Follow Up Recommendations  Home health PT     Assistance Recommended at Discharge Intermittent Supervision/Assistance  Patient can return home with the following A little help with walking and/or transfers;Help with stairs or ramp for entrance;Assistance with cooking/housework;A lot of help with bathing/dressing/bathroom   Equipment Recommendations  None recommended by PT    Recommendations for Other Services       Precautions / Restrictions Precautions Precautions: Fall Precaution Comments: ostomy, left side drain Restrictions Weight Bearing Restrictions: No     Mobility  Bed Mobility Overal bed mobility: Modified Independent             General bed mobility comments: self able with increased time    Transfers Overall transfer level:  Modified independent   Transfers: Sit to/from Stand             General transfer comment: self able    Ambulation/Gait Ambulation/Gait assistance: Supervision Gait Distance (Feet): 550 Feet Assistive device: None Gait Pattern/deviations: WFL(Within Functional Limits) Gait velocity: decreased     General Gait Details: amb without any AD this session.  Tolerated a great functional distance.  Short steps but alternating and decreased speed but functional.   Stairs             Wheelchair Mobility    Modified Rankin (Stroke Patients Only)       Balance                                            Cognition Arousal/Alertness: Awake/alert Behavior During Therapy: WFL for tasks assessed/performed                                   General Comments: AxO x 3 very pleasant and willing Spanish speaking gentleman.  Family present to assist with laguage.        Exercises      General Comments        Pertinent Vitals/Pain Pain Assessment Pain Assessment: No/denies pain    Home Living                          Prior Function            PT Goals (current goals  can now be found in the care plan section)      Frequency    Min 3X/week      PT Plan Current plan remains appropriate    Co-evaluation              AM-PAC PT "6 Clicks" Mobility   Outcome Measure  Help needed turning from your back to your side while in a flat bed without using bedrails?: None Help needed moving from lying on your back to sitting on the side of a flat bed without using bedrails?: None Help needed moving to and from a bed to a chair (including a wheelchair)?: None Help needed standing up from a chair using your arms (e.g., wheelchair or bedside chair)?: None Help needed to walk in hospital room?: A Little   6 Click Score: 19    End of Session Equipment Utilized During Treatment: Gait belt Activity Tolerance: Patient  tolerated treatment well Patient left: in bed;with family/visitor present;with call bell/phone within reach Nurse Communication: Mobility status PT Visit Diagnosis: Unsteadiness on feet (R26.81);Muscle weakness (generalized) (M62.81);Pain     Time: [id number] PT Time Calculation (min) (ACUTE ONLY): 17 min  Charges:  $Gait Training: 8-22 mins                     Rica Koyanagi  PTA [place] Office M-F          [phone] Weekend pager [phone]

## 2022-11-09 NOTE — Consult Note (Addendum)
  [facility] Nurse ostomy consult note Ostomy teaching performed with family member present to translate. Wife performed pouching steps with minimal assistance. Pt watched using a hand held mirror.  Stoma type/location: Stoma is red and viable, 1 1/2 inches, above skin level.  2 stints in place, mod amt pink urine Education provided:  Wife was able to stretch barrier ring and apply to back of pouch, and apply pouch to skin.  Pt and wife were able to open and close spout to empty, and connect and disconnect bedside drainage bag. Reviewed ordering supplies and pouching routines. Use Supplies: barrier ring, Kellie Simmering # [id number], and Roselee Culver # 3 Enrolled patient in [place] program: Yes, today. 5 sets of supplies left in the room, along with educational materials.  Thank-you,  Julien Girt MSN, [place], [place], [place], [place]

## 2022-11-09 NOTE — Progress Notes (Signed)
5 Days Post-Op   Subjective/Chief Complaint:  1 - Bladder Cancer - s/p robotic cystoprostatectomy with ICG sentinal + template node dissection and conduit diversion 11/04/22, path pT1aN0Mx with negative margins. Also some incidetina pT2N0Mx grade 2 prostate cancer with negative margins. Admitted day prior for stomal marking + bowel prep. POD 5 JP Cr 0.7 and removed.   2 - Post-Op Ileus - s/p planned bowel anastomosis as part of surgery on 11/04/22. NPO initially, ice chips POD 1. Clears POD 2, some small BM and gas POD 2. Fulls POD 3 and regular POD 4.   3 - Disposition / Rehab - independent at baseline in all ADL's. PT recs HHPT.   Today "Joel Adkins" is progressing. Tolerating reg diet w/o emesis.    Objective: Vital signs in last 24 hours: Temp:  [98.3 F (36.8 C)-98.6 F (37 C)] 98.3 F (36.8 C) (03/11 0450) Pulse Rate:  [88-110] 92 (03/11 0450) Resp:  [16-18] 16 (03/11 0450) BP: (134-145)/(74) 134/74 (03/11 0450) SpO2:  [92 %-96 %] 95 % (03/11 0450) Last BM Date : 11/06/22  Intake/Output from previous day: 03/10 0701 - 03/11 0700 In: 700 [P.O.:700] Out: 1320 [Urine:900; Drains:420] Intake/Output this shift: Total I/O In: 240 [P.O.:240] Out: 160 [Drains:160]  NAD, AOx3, pleasant. Interviewed with son on speaker phone. Non-labored breathing on RA. A RRR SNTND. RLQ Urostomy pink/patent with Rt (red) and Lt (blue) bander stents and copious non-foul urine. JP removed and dry dressing placed.  SCD's in place.  Lab Results:  Recent Labs    11/08/22 0430 11/09/22 0349  HGB 12.7* 12.0*  HCT 39.1 38.0*   BMET Recent Labs    11/08/22 0720 11/09/22 0349  NA 133* 131*  K 4.1 3.9  CL 101 99  CO2 24 24  GLUCOSE 134* 113*  BUN 18 18  CREATININE 0.82 0.67  CALCIUM 8.1* 8.1*   PT/INR No results for input(s): "LABPROT", "INR" in the last 72 hours. ABG No results for input(s): "PHART", "HCO3" in the last 72 hours.  Invalid input(s): "PCO2", "PO2"  Studies/Results: No  results found.  Anti-infectives: Anti-infectives (From admission, onward)    Start     Dose/Rate Route Frequency Ordered Stop   11/04/22 1800  piperacillin-tazobactam (ZOSYN) IVPB 3.375 g        3.375 g 12.5 mL/hr over 240 Minutes Intravenous Every 8 hours 11/04/22 1524 11/05/22 1407   11/04/22 0817  piperacillin-tazobactam (ZOSYN) 3.375 GM/50ML IVPB  Status:  Discontinued       Note to Pharmacy: Timoteo Expose: cabinet override      11/04/22 0817 11/04/22 0822   11/04/22 0600  piperacillin-tazobactam (ZOSYN) IVPB 3.375 g        3.375 g 100 mL/hr over 30 Minutes Intravenous 30 min pre-op 11/03/22 1603 11/04/22 0929   11/03/22 1715  neomycin (MYCIFRADIN) tablet 500 mg        500 mg Oral Every 4 hours 11/03/22 1627 11/03/22 2012   11/03/22 1715  metroNIDAZOLE (FLAGYL) tablet 500 mg        500 mg Oral Every 4 hours 11/03/22 1627 11/03/22 2011       Assessment/Plan:  Check JP Cr today, Likely Dc tomorrow hopefully with HHPT and RN. Discussed very good prognosis with margin negative, node negative bladder and prostate cancer.    Alexis Frock 11/09/2022

## 2022-11-09 NOTE — Plan of Care (Signed)

## 2022-11-09 NOTE — Care Management Important Message (Signed)
Important Message  Patient Details  Name: Joel Adkins MRN: [id number] Date of Birth: 1941-05-24   Medicare Important Message Given:  Yes     Memory Argue 11/09/2022, 1:50 PM

## 2022-11-09 NOTE — TOC Transition Note (Signed)
Transition of Care [facility]) - CM/SW Discharge Note   Patient Details  Name: Joel Adkins MRN: [id number] Date of Birth: March 14, 1941  [facility]) CM/SW Contact:  Illene Regulus, LCSW Phone Number: 11/09/2022, 1:16 PM   Clinical Narrative:    CSW received consult regarding Home Health services. HHRN/PT was arranged with [facility] on 11/06/22. Per chart review pt was set up to receive ostomy supplies though [facility]. No additional TOC needs , TOC sign off.    Barriers to Discharge: Continued Medical Work up   Patient Goals and CMS Choice CMS Medicare.gov Compare Post Acute Care list provided to:: Patient Choice offered to / list presented to : Patient  Discharge Placement                         Discharge Plan and Services Additional resources added to the After Visit Summary for                      Date DME Agency Contacted: 11/06/22 Time DME Agency Contacted: 69 Representative spoke with at DME Agency: [place]: RN [place] Agency: [place] Date [place]: 11/06/22 Time [place]: 1117 Representative spoke with at [place]: kelly  Social Determinants of Health ([place]) Interventions [place]: No Food Insecurity (11/03/2022)  Housing: Low Risk  (11/03/2022)  Transportation Needs: No Transportation Needs (11/03/2022)  Utilities: Not At Risk (11/03/2022)  Depression (PHQ2-9): Low Risk  (08/27/2022)  Tobacco Use: Medium Risk (11/05/2022)     Readmission Risk Interventions     No data to display

## 2022-11-10 MED ORDER — SENNOSIDES-DOCUSATE SODIUM 8.6-50 MG PO TABS: 1.0000 | ORAL_TABLET | Freq: Two times a day (BID) | ORAL | 0 refills | Status: AC

## 2022-11-10 MED ORDER — OXYCODONE-ACETAMINOPHEN 5-325 MG PO TABS: 1.0000 | ORAL_TABLET | Freq: Four times a day (QID) | ORAL | 0 refills | Status: AC | PRN

## 2022-11-10 NOTE — Progress Notes (Signed)
Chaplain worked to honor consult for request for prayer. Chaplain used Spanish interpreter to understand Joel Adkins's needs. Within discussion, he revealed that he wanted to pray at discharge and with his family only. He did not want prayer from [place]. Chaplain assesses that this may be due to gender of Chaplain and other demographic information. [place] may respond better to male, familial presences.  Chaplain offered support and is available as needed.   Chaplain Kinzey Sheriff, MDiv  11/10/22 1100  Spiritual Encounters  Type of Visit Initial  Care provided to: Patient  Reason for visit Routine spiritual support  Spiritual Framework  Needs/Challenges/Barriers Desires family prayer versus Chaplain support  Interventions  Spiritual Care Interventions Made Established relationship of care and support;Reflective listening  Intervention Outcomes  Outcomes Awareness of support

## 2022-11-10 NOTE — Discharge Summary (Signed)
Physician Discharge Summary  Patient ID: Joel Adkins MRN: [id number] DOB/AGE: Jun 01, 1941 82 y.o.  Admit date: 11/03/2022 Discharge date: 11/10/2022  Admission Diagnoses: Bladder Cancer  Discharge Diagnoses:  Principal Problem:   Bladder cancer [facility]) Prostate Cancer  Discharged Condition: good  Hospital Course:   1 - Bladder Cancer - s/p robotic cystoprostatectomy with ICG sentinal + template node dissection and conduit diversion 11/04/22, path pT1aN0Mx with negative margins. Also some incidetina pT2N0Mx grade 2 prostate cancer with negative margins. Admitted day prior for stomal marking + bowel prep. POD 5 JP Cr 0.7 and removed.   2 - Post-Op Ileus - s/p planned bowel anastomosis as part of surgery on 11/04/22. NPO initially, ice chips POD 1. Clears POD 2, some small BM and gas POD 2. Fulls POD 3 and regular POD 4.   3 - Disposition / Rehab - independent at baseline in all ADL's. PT recs HHPT.  Home Health RN and PT arranged at discharge.   By the early afternoon of 11/10/22, the day of discharge, he is ambulatory, tollerating PO nutrition, pain controlled on PO meds and felt to be adequate for discharge.   Consults:  case management, PT, ostomy RN  Significant Diagnostic Studies: labs: as per above  Treatments: surgery: as per above  Discharge Exam: Blood pressure 134/69, pulse (!) 104, temperature 98 F (36.7 C), temperature source Oral, resp. rate 18, height '5\' 3"'$  (1.6 m), weight 71.2 kg, SpO2 94 %.  NAD, AOx3, pleasant. Interviewed with son and friend on speaker phone. Non-labored breathing on RA.  RRR SNTND. RLQ Urostomy pink/patent with Rt (red) and Lt (blue) bander stents and copious non-foul urine. Prior JP site c/d/I with dry dressing.  SCD's in place.  Disposition: HOME   Allergies as of 11/10/2022   No Known Allergies      Medication List     STOP taking these medications    finasteride 5 MG tablet Commonly known as: PROSCAR   tamsulosin 0.4 MG Caps  capsule Commonly known as: FLOMAX   traMADol 50 MG tablet Commonly known as: Ultram       TAKE these medications    atorvastatin 10 MG tablet Commonly known as: LIPITOR Take 1 tablet (10 mg total) by mouth daily.   levothyroxine 75 MCG tablet Commonly known as: SYNTHROID Take 1 tablet (75 mcg total) by mouth daily before breakfast.   oxyCODONE-acetaminophen 5-325 MG tablet Commonly known as: Percocet Take 1 tablet by mouth every 6 (six) hours as needed for severe pain or moderate pain (post-operatively. (please label in spanish)).   senna-docusate 8.6-50 MG tablet Commonly known as: Senokot-S Take 1 tablet by mouth 2 (two) times daily. While taking strong pain meds to prevent constipation (please label in Spanish)        Follow-up Information     Alexis Frock, MD Follow up on 11/30/2022.   Specialty: Urology Why: at 75 AM for MD visit Contact information: [place] [address] [phone]         [facility], [place] Follow up.   Specialty: [place] Why: will follow up with you 24 to 48hr after d/c Contact information: [address] [place] [address] [phone]                 Signed: Alexis Frock 11/10/2022, 11:57 AM

## 2022-11-12 DIAGNOSIS — Z436 Encounter for attention to other artificial openings of urinary tract: Secondary | ICD-10-CM | POA: Diagnosis not present

## 2022-11-12 DIAGNOSIS — N400 Enlarged prostate without lower urinary tract symptoms: Secondary | ICD-10-CM | POA: Diagnosis not present

## 2022-11-12 DIAGNOSIS — Z96642 Presence of left artificial hip joint: Secondary | ICD-10-CM | POA: Diagnosis not present

## 2022-11-12 DIAGNOSIS — Z87891 Personal history of nicotine dependence: Secondary | ICD-10-CM | POA: Diagnosis not present

## 2022-11-12 DIAGNOSIS — C678 Malignant neoplasm of overlapping sites of bladder: Secondary | ICD-10-CM | POA: Diagnosis not present

## 2022-11-12 DIAGNOSIS — J45909 Unspecified asthma, uncomplicated: Secondary | ICD-10-CM | POA: Diagnosis not present

## 2022-11-12 DIAGNOSIS — E039 Hypothyroidism, unspecified: Secondary | ICD-10-CM | POA: Diagnosis not present

## 2022-11-12 DIAGNOSIS — Z483 Aftercare following surgery for neoplasm: Secondary | ICD-10-CM | POA: Diagnosis not present

## 2022-11-12 DIAGNOSIS — C61 Malignant neoplasm of prostate: Secondary | ICD-10-CM | POA: Diagnosis not present

## 2022-11-12 DIAGNOSIS — Z8616 Personal history of COVID-19: Secondary | ICD-10-CM | POA: Diagnosis not present

## 2022-11-12 DIAGNOSIS — R7303 Prediabetes: Secondary | ICD-10-CM | POA: Diagnosis not present

## 2022-11-12 DIAGNOSIS — E785 Hyperlipidemia, unspecified: Secondary | ICD-10-CM | POA: Diagnosis not present

## 2022-11-13 ENCOUNTER — Telehealth: Payer: Self-pay | Admitting: Emergency Medicine

## 2022-11-13 NOTE — Telephone Encounter (Signed)
A nurse from [facility] called for verbal orders for PT with a frequency of: 2X for 8 weeks & 1PRN visit for skilled nursing  Please call to confirm: [phone]

## 2022-11-14 NOTE — Telephone Encounter (Signed)
Okay with verbal orders as requested.  Thanks.

## 2022-11-16 NOTE — Telephone Encounter (Signed)
Called nurse at [facility] to ok verbals per provider

## 2022-11-17 DIAGNOSIS — E785 Hyperlipidemia, unspecified: Secondary | ICD-10-CM | POA: Diagnosis not present

## 2022-11-17 DIAGNOSIS — Z8616 Personal history of COVID-19: Secondary | ICD-10-CM | POA: Diagnosis not present

## 2022-11-17 DIAGNOSIS — N400 Enlarged prostate without lower urinary tract symptoms: Secondary | ICD-10-CM | POA: Diagnosis not present

## 2022-11-17 DIAGNOSIS — J45909 Unspecified asthma, uncomplicated: Secondary | ICD-10-CM | POA: Diagnosis not present

## 2022-11-17 DIAGNOSIS — Z87891 Personal history of nicotine dependence: Secondary | ICD-10-CM | POA: Diagnosis not present

## 2022-11-17 DIAGNOSIS — Z96642 Presence of left artificial hip joint: Secondary | ICD-10-CM | POA: Diagnosis not present

## 2022-11-17 DIAGNOSIS — Z483 Aftercare following surgery for neoplasm: Secondary | ICD-10-CM | POA: Diagnosis not present

## 2022-11-17 DIAGNOSIS — C61 Malignant neoplasm of prostate: Secondary | ICD-10-CM | POA: Diagnosis not present

## 2022-11-17 DIAGNOSIS — E039 Hypothyroidism, unspecified: Secondary | ICD-10-CM | POA: Diagnosis not present

## 2022-11-17 DIAGNOSIS — R7303 Prediabetes: Secondary | ICD-10-CM | POA: Diagnosis not present

## 2022-11-17 DIAGNOSIS — Z436 Encounter for attention to other artificial openings of urinary tract: Secondary | ICD-10-CM | POA: Diagnosis not present

## 2022-11-17 DIAGNOSIS — C678 Malignant neoplasm of overlapping sites of bladder: Secondary | ICD-10-CM | POA: Diagnosis not present

## 2022-11-19 DIAGNOSIS — Z96642 Presence of left artificial hip joint: Secondary | ICD-10-CM | POA: Diagnosis not present

## 2022-11-19 DIAGNOSIS — Z483 Aftercare following surgery for neoplasm: Secondary | ICD-10-CM | POA: Diagnosis not present

## 2022-11-19 DIAGNOSIS — C61 Malignant neoplasm of prostate: Secondary | ICD-10-CM | POA: Diagnosis not present

## 2022-11-19 DIAGNOSIS — E785 Hyperlipidemia, unspecified: Secondary | ICD-10-CM | POA: Diagnosis not present

## 2022-11-19 DIAGNOSIS — N400 Enlarged prostate without lower urinary tract symptoms: Secondary | ICD-10-CM | POA: Diagnosis not present

## 2022-11-19 DIAGNOSIS — Z87891 Personal history of nicotine dependence: Secondary | ICD-10-CM | POA: Diagnosis not present

## 2022-11-19 DIAGNOSIS — J45909 Unspecified asthma, uncomplicated: Secondary | ICD-10-CM | POA: Diagnosis not present

## 2022-11-19 DIAGNOSIS — R7303 Prediabetes: Secondary | ICD-10-CM | POA: Diagnosis not present

## 2022-11-19 DIAGNOSIS — Z436 Encounter for attention to other artificial openings of urinary tract: Secondary | ICD-10-CM | POA: Diagnosis not present

## 2022-11-19 DIAGNOSIS — E039 Hypothyroidism, unspecified: Secondary | ICD-10-CM | POA: Diagnosis not present

## 2022-11-19 DIAGNOSIS — Z8616 Personal history of COVID-19: Secondary | ICD-10-CM | POA: Diagnosis not present

## 2022-11-19 DIAGNOSIS — C678 Malignant neoplasm of overlapping sites of bladder: Secondary | ICD-10-CM | POA: Diagnosis not present

## 2022-11-24 DIAGNOSIS — C61 Malignant neoplasm of prostate: Secondary | ICD-10-CM | POA: Diagnosis not present

## 2022-11-24 DIAGNOSIS — E785 Hyperlipidemia, unspecified: Secondary | ICD-10-CM | POA: Diagnosis not present

## 2022-11-24 DIAGNOSIS — C678 Malignant neoplasm of overlapping sites of bladder: Secondary | ICD-10-CM | POA: Diagnosis not present

## 2022-11-24 DIAGNOSIS — Z96642 Presence of left artificial hip joint: Secondary | ICD-10-CM | POA: Diagnosis not present

## 2022-11-24 DIAGNOSIS — J45909 Unspecified asthma, uncomplicated: Secondary | ICD-10-CM | POA: Diagnosis not present

## 2022-11-24 DIAGNOSIS — N400 Enlarged prostate without lower urinary tract symptoms: Secondary | ICD-10-CM | POA: Diagnosis not present

## 2022-11-24 DIAGNOSIS — Z87891 Personal history of nicotine dependence: Secondary | ICD-10-CM | POA: Diagnosis not present

## 2022-11-24 DIAGNOSIS — Z483 Aftercare following surgery for neoplasm: Secondary | ICD-10-CM | POA: Diagnosis not present

## 2022-11-24 DIAGNOSIS — E039 Hypothyroidism, unspecified: Secondary | ICD-10-CM | POA: Diagnosis not present

## 2022-11-24 DIAGNOSIS — R7303 Prediabetes: Secondary | ICD-10-CM | POA: Diagnosis not present

## 2022-11-24 DIAGNOSIS — Z8616 Personal history of COVID-19: Secondary | ICD-10-CM | POA: Diagnosis not present

## 2022-11-24 DIAGNOSIS — Z436 Encounter for attention to other artificial openings of urinary tract: Secondary | ICD-10-CM | POA: Diagnosis not present

## 2022-11-27 DIAGNOSIS — Z436 Encounter for attention to other artificial openings of urinary tract: Secondary | ICD-10-CM | POA: Diagnosis not present

## 2022-11-27 DIAGNOSIS — E785 Hyperlipidemia, unspecified: Secondary | ICD-10-CM | POA: Diagnosis not present

## 2022-11-27 DIAGNOSIS — Z483 Aftercare following surgery for neoplasm: Secondary | ICD-10-CM | POA: Diagnosis not present

## 2022-11-27 DIAGNOSIS — C61 Malignant neoplasm of prostate: Secondary | ICD-10-CM | POA: Diagnosis not present

## 2022-11-27 DIAGNOSIS — Z87891 Personal history of nicotine dependence: Secondary | ICD-10-CM | POA: Diagnosis not present

## 2022-11-27 DIAGNOSIS — C678 Malignant neoplasm of overlapping sites of bladder: Secondary | ICD-10-CM | POA: Diagnosis not present

## 2022-11-27 DIAGNOSIS — N400 Enlarged prostate without lower urinary tract symptoms: Secondary | ICD-10-CM | POA: Diagnosis not present

## 2022-11-27 DIAGNOSIS — J45909 Unspecified asthma, uncomplicated: Secondary | ICD-10-CM | POA: Diagnosis not present

## 2022-11-27 DIAGNOSIS — Z8616 Personal history of COVID-19: Secondary | ICD-10-CM | POA: Diagnosis not present

## 2022-11-27 DIAGNOSIS — R7303 Prediabetes: Secondary | ICD-10-CM | POA: Diagnosis not present

## 2022-11-27 DIAGNOSIS — Z96642 Presence of left artificial hip joint: Secondary | ICD-10-CM | POA: Diagnosis not present

## 2022-11-27 DIAGNOSIS — E039 Hypothyroidism, unspecified: Secondary | ICD-10-CM | POA: Diagnosis not present

## 2022-11-30 DIAGNOSIS — R8271 Bacteriuria: Secondary | ICD-10-CM | POA: Diagnosis not present

## 2022-12-02 DIAGNOSIS — Z87891 Personal history of nicotine dependence: Secondary | ICD-10-CM | POA: Diagnosis not present

## 2022-12-02 DIAGNOSIS — Z436 Encounter for attention to other artificial openings of urinary tract: Secondary | ICD-10-CM | POA: Diagnosis not present

## 2022-12-02 DIAGNOSIS — Z96642 Presence of left artificial hip joint: Secondary | ICD-10-CM | POA: Diagnosis not present

## 2022-12-02 DIAGNOSIS — Z8616 Personal history of COVID-19: Secondary | ICD-10-CM | POA: Diagnosis not present

## 2022-12-02 DIAGNOSIS — E785 Hyperlipidemia, unspecified: Secondary | ICD-10-CM | POA: Diagnosis not present

## 2022-12-02 DIAGNOSIS — N400 Enlarged prostate without lower urinary tract symptoms: Secondary | ICD-10-CM | POA: Diagnosis not present

## 2022-12-02 DIAGNOSIS — R7303 Prediabetes: Secondary | ICD-10-CM | POA: Diagnosis not present

## 2022-12-02 DIAGNOSIS — E039 Hypothyroidism, unspecified: Secondary | ICD-10-CM | POA: Diagnosis not present

## 2022-12-02 DIAGNOSIS — C61 Malignant neoplasm of prostate: Secondary | ICD-10-CM | POA: Diagnosis not present

## 2022-12-02 DIAGNOSIS — Z483 Aftercare following surgery for neoplasm: Secondary | ICD-10-CM | POA: Diagnosis not present

## 2022-12-02 DIAGNOSIS — C678 Malignant neoplasm of overlapping sites of bladder: Secondary | ICD-10-CM | POA: Diagnosis not present

## 2022-12-02 DIAGNOSIS — J45909 Unspecified asthma, uncomplicated: Secondary | ICD-10-CM | POA: Diagnosis not present

## 2022-12-04 DIAGNOSIS — J45909 Unspecified asthma, uncomplicated: Secondary | ICD-10-CM | POA: Diagnosis not present

## 2022-12-04 DIAGNOSIS — Z87891 Personal history of nicotine dependence: Secondary | ICD-10-CM | POA: Diagnosis not present

## 2022-12-04 DIAGNOSIS — N400 Enlarged prostate without lower urinary tract symptoms: Secondary | ICD-10-CM | POA: Diagnosis not present

## 2022-12-04 DIAGNOSIS — E785 Hyperlipidemia, unspecified: Secondary | ICD-10-CM | POA: Diagnosis not present

## 2022-12-04 DIAGNOSIS — Z8616 Personal history of COVID-19: Secondary | ICD-10-CM | POA: Diagnosis not present

## 2022-12-04 DIAGNOSIS — C678 Malignant neoplasm of overlapping sites of bladder: Secondary | ICD-10-CM | POA: Diagnosis not present

## 2022-12-04 DIAGNOSIS — Z483 Aftercare following surgery for neoplasm: Secondary | ICD-10-CM | POA: Diagnosis not present

## 2022-12-04 DIAGNOSIS — R7303 Prediabetes: Secondary | ICD-10-CM | POA: Diagnosis not present

## 2022-12-04 DIAGNOSIS — Z96642 Presence of left artificial hip joint: Secondary | ICD-10-CM | POA: Diagnosis not present

## 2022-12-04 DIAGNOSIS — E039 Hypothyroidism, unspecified: Secondary | ICD-10-CM | POA: Diagnosis not present

## 2022-12-04 DIAGNOSIS — C61 Malignant neoplasm of prostate: Secondary | ICD-10-CM | POA: Diagnosis not present

## 2022-12-04 DIAGNOSIS — Z436 Encounter for attention to other artificial openings of urinary tract: Secondary | ICD-10-CM | POA: Diagnosis not present

## 2022-12-08 DIAGNOSIS — Z436 Encounter for attention to other artificial openings of urinary tract: Secondary | ICD-10-CM | POA: Diagnosis not present

## 2022-12-08 DIAGNOSIS — E039 Hypothyroidism, unspecified: Secondary | ICD-10-CM | POA: Diagnosis not present

## 2022-12-08 DIAGNOSIS — C61 Malignant neoplasm of prostate: Secondary | ICD-10-CM | POA: Diagnosis not present

## 2022-12-08 DIAGNOSIS — E785 Hyperlipidemia, unspecified: Secondary | ICD-10-CM | POA: Diagnosis not present

## 2022-12-08 DIAGNOSIS — C678 Malignant neoplasm of overlapping sites of bladder: Secondary | ICD-10-CM | POA: Diagnosis not present

## 2022-12-08 DIAGNOSIS — N400 Enlarged prostate without lower urinary tract symptoms: Secondary | ICD-10-CM | POA: Diagnosis not present

## 2022-12-08 DIAGNOSIS — Z87891 Personal history of nicotine dependence: Secondary | ICD-10-CM | POA: Diagnosis not present

## 2022-12-08 DIAGNOSIS — Z8616 Personal history of COVID-19: Secondary | ICD-10-CM | POA: Diagnosis not present

## 2022-12-08 DIAGNOSIS — R7303 Prediabetes: Secondary | ICD-10-CM | POA: Diagnosis not present

## 2022-12-08 DIAGNOSIS — Z483 Aftercare following surgery for neoplasm: Secondary | ICD-10-CM | POA: Diagnosis not present

## 2022-12-08 DIAGNOSIS — J45909 Unspecified asthma, uncomplicated: Secondary | ICD-10-CM | POA: Diagnosis not present

## 2022-12-08 DIAGNOSIS — Z96642 Presence of left artificial hip joint: Secondary | ICD-10-CM | POA: Diagnosis not present

## 2022-12-14 DIAGNOSIS — R8271 Bacteriuria: Secondary | ICD-10-CM | POA: Diagnosis not present

## 2022-12-17 DIAGNOSIS — N400 Enlarged prostate without lower urinary tract symptoms: Secondary | ICD-10-CM | POA: Diagnosis not present

## 2022-12-17 DIAGNOSIS — E039 Hypothyroidism, unspecified: Secondary | ICD-10-CM | POA: Diagnosis not present

## 2022-12-17 DIAGNOSIS — Z96642 Presence of left artificial hip joint: Secondary | ICD-10-CM | POA: Diagnosis not present

## 2022-12-17 DIAGNOSIS — Z436 Encounter for attention to other artificial openings of urinary tract: Secondary | ICD-10-CM | POA: Diagnosis not present

## 2022-12-17 DIAGNOSIS — Z87891 Personal history of nicotine dependence: Secondary | ICD-10-CM | POA: Diagnosis not present

## 2022-12-17 DIAGNOSIS — Z8616 Personal history of COVID-19: Secondary | ICD-10-CM | POA: Diagnosis not present

## 2022-12-17 DIAGNOSIS — Z483 Aftercare following surgery for neoplasm: Secondary | ICD-10-CM | POA: Diagnosis not present

## 2022-12-17 DIAGNOSIS — E785 Hyperlipidemia, unspecified: Secondary | ICD-10-CM | POA: Diagnosis not present

## 2022-12-17 DIAGNOSIS — C61 Malignant neoplasm of prostate: Secondary | ICD-10-CM | POA: Diagnosis not present

## 2022-12-17 DIAGNOSIS — J45909 Unspecified asthma, uncomplicated: Secondary | ICD-10-CM | POA: Diagnosis not present

## 2022-12-17 DIAGNOSIS — C678 Malignant neoplasm of overlapping sites of bladder: Secondary | ICD-10-CM | POA: Diagnosis not present

## 2022-12-17 DIAGNOSIS — R7303 Prediabetes: Secondary | ICD-10-CM | POA: Diagnosis not present

## 2022-12-22 DIAGNOSIS — C61 Malignant neoplasm of prostate: Secondary | ICD-10-CM | POA: Diagnosis not present

## 2022-12-22 DIAGNOSIS — R7303 Prediabetes: Secondary | ICD-10-CM | POA: Diagnosis not present

## 2022-12-22 DIAGNOSIS — C678 Malignant neoplasm of overlapping sites of bladder: Secondary | ICD-10-CM | POA: Diagnosis not present

## 2022-12-22 DIAGNOSIS — J45909 Unspecified asthma, uncomplicated: Secondary | ICD-10-CM | POA: Diagnosis not present

## 2022-12-22 DIAGNOSIS — E039 Hypothyroidism, unspecified: Secondary | ICD-10-CM | POA: Diagnosis not present

## 2022-12-22 DIAGNOSIS — Z436 Encounter for attention to other artificial openings of urinary tract: Secondary | ICD-10-CM | POA: Diagnosis not present

## 2022-12-22 DIAGNOSIS — E785 Hyperlipidemia, unspecified: Secondary | ICD-10-CM | POA: Diagnosis not present

## 2022-12-22 DIAGNOSIS — Z8616 Personal history of COVID-19: Secondary | ICD-10-CM | POA: Diagnosis not present

## 2022-12-22 DIAGNOSIS — Z87891 Personal history of nicotine dependence: Secondary | ICD-10-CM | POA: Diagnosis not present

## 2022-12-22 DIAGNOSIS — N400 Enlarged prostate without lower urinary tract symptoms: Secondary | ICD-10-CM | POA: Diagnosis not present

## 2022-12-22 DIAGNOSIS — Z483 Aftercare following surgery for neoplasm: Secondary | ICD-10-CM | POA: Diagnosis not present

## 2022-12-22 DIAGNOSIS — Z96642 Presence of left artificial hip joint: Secondary | ICD-10-CM | POA: Diagnosis not present

## 2022-12-24 DIAGNOSIS — Z96649 Presence of unspecified artificial hip joint: Secondary | ICD-10-CM | POA: Diagnosis not present

## 2022-12-24 DIAGNOSIS — Z8546 Personal history of malignant neoplasm of prostate: Secondary | ICD-10-CM | POA: Diagnosis not present

## 2022-12-24 DIAGNOSIS — Z961 Presence of intraocular lens: Secondary | ICD-10-CM | POA: Diagnosis not present

## 2022-12-24 DIAGNOSIS — Z8551 Personal history of malignant neoplasm of bladder: Secondary | ICD-10-CM | POA: Diagnosis not present

## 2022-12-24 DIAGNOSIS — Z85841 Personal history of malignant neoplasm of brain: Secondary | ICD-10-CM | POA: Diagnosis not present

## 2022-12-24 DIAGNOSIS — Z936 Other artificial openings of urinary tract status: Secondary | ICD-10-CM | POA: Diagnosis not present

## 2022-12-24 DIAGNOSIS — Z8673 Personal history of transient ischemic attack (TIA), and cerebral infarction without residual deficits: Secondary | ICD-10-CM | POA: Diagnosis not present

## 2022-12-24 DIAGNOSIS — E785 Hyperlipidemia, unspecified: Secondary | ICD-10-CM | POA: Diagnosis not present

## 2022-12-24 DIAGNOSIS — R030 Elevated blood-pressure reading, without diagnosis of hypertension: Secondary | ICD-10-CM | POA: Diagnosis not present

## 2022-12-24 DIAGNOSIS — Z5982 Transportation insecurity: Secondary | ICD-10-CM | POA: Diagnosis not present

## 2022-12-28 DIAGNOSIS — R7303 Prediabetes: Secondary | ICD-10-CM | POA: Diagnosis not present

## 2022-12-28 DIAGNOSIS — E039 Hypothyroidism, unspecified: Secondary | ICD-10-CM | POA: Diagnosis not present

## 2022-12-28 DIAGNOSIS — C61 Malignant neoplasm of prostate: Secondary | ICD-10-CM | POA: Diagnosis not present

## 2022-12-28 DIAGNOSIS — Z436 Encounter for attention to other artificial openings of urinary tract: Secondary | ICD-10-CM | POA: Diagnosis not present

## 2022-12-28 DIAGNOSIS — Z96642 Presence of left artificial hip joint: Secondary | ICD-10-CM | POA: Diagnosis not present

## 2022-12-28 DIAGNOSIS — Z87891 Personal history of nicotine dependence: Secondary | ICD-10-CM | POA: Diagnosis not present

## 2022-12-28 DIAGNOSIS — N400 Enlarged prostate without lower urinary tract symptoms: Secondary | ICD-10-CM | POA: Diagnosis not present

## 2022-12-28 DIAGNOSIS — E785 Hyperlipidemia, unspecified: Secondary | ICD-10-CM | POA: Diagnosis not present

## 2022-12-28 DIAGNOSIS — Z483 Aftercare following surgery for neoplasm: Secondary | ICD-10-CM | POA: Diagnosis not present

## 2022-12-28 DIAGNOSIS — Z8616 Personal history of COVID-19: Secondary | ICD-10-CM | POA: Diagnosis not present

## 2022-12-28 DIAGNOSIS — C678 Malignant neoplasm of overlapping sites of bladder: Secondary | ICD-10-CM | POA: Diagnosis not present

## 2022-12-28 DIAGNOSIS — J45909 Unspecified asthma, uncomplicated: Secondary | ICD-10-CM | POA: Diagnosis not present

## 2023-01-01 DIAGNOSIS — J45909 Unspecified asthma, uncomplicated: Secondary | ICD-10-CM | POA: Diagnosis not present

## 2023-01-01 DIAGNOSIS — N400 Enlarged prostate without lower urinary tract symptoms: Secondary | ICD-10-CM | POA: Diagnosis not present

## 2023-01-01 DIAGNOSIS — Z436 Encounter for attention to other artificial openings of urinary tract: Secondary | ICD-10-CM | POA: Diagnosis not present

## 2023-01-01 DIAGNOSIS — Z483 Aftercare following surgery for neoplasm: Secondary | ICD-10-CM | POA: Diagnosis not present

## 2023-01-01 DIAGNOSIS — Z8616 Personal history of COVID-19: Secondary | ICD-10-CM | POA: Diagnosis not present

## 2023-01-01 DIAGNOSIS — Z87891 Personal history of nicotine dependence: Secondary | ICD-10-CM | POA: Diagnosis not present

## 2023-01-01 DIAGNOSIS — Z96642 Presence of left artificial hip joint: Secondary | ICD-10-CM | POA: Diagnosis not present

## 2023-01-01 DIAGNOSIS — C61 Malignant neoplasm of prostate: Secondary | ICD-10-CM | POA: Diagnosis not present

## 2023-01-01 DIAGNOSIS — E039 Hypothyroidism, unspecified: Secondary | ICD-10-CM | POA: Diagnosis not present

## 2023-01-01 DIAGNOSIS — R7303 Prediabetes: Secondary | ICD-10-CM | POA: Diagnosis not present

## 2023-01-01 DIAGNOSIS — E785 Hyperlipidemia, unspecified: Secondary | ICD-10-CM | POA: Diagnosis not present

## 2023-01-01 DIAGNOSIS — C678 Malignant neoplasm of overlapping sites of bladder: Secondary | ICD-10-CM | POA: Diagnosis not present

## 2023-01-04 DIAGNOSIS — Z96642 Presence of left artificial hip joint: Secondary | ICD-10-CM | POA: Diagnosis not present

## 2023-01-04 DIAGNOSIS — Z87891 Personal history of nicotine dependence: Secondary | ICD-10-CM | POA: Diagnosis not present

## 2023-01-04 DIAGNOSIS — E785 Hyperlipidemia, unspecified: Secondary | ICD-10-CM | POA: Diagnosis not present

## 2023-01-04 DIAGNOSIS — N400 Enlarged prostate without lower urinary tract symptoms: Secondary | ICD-10-CM | POA: Diagnosis not present

## 2023-01-04 DIAGNOSIS — C678 Malignant neoplasm of overlapping sites of bladder: Secondary | ICD-10-CM | POA: Diagnosis not present

## 2023-01-04 DIAGNOSIS — J45909 Unspecified asthma, uncomplicated: Secondary | ICD-10-CM | POA: Diagnosis not present

## 2023-01-04 DIAGNOSIS — Z8616 Personal history of COVID-19: Secondary | ICD-10-CM | POA: Diagnosis not present

## 2023-01-04 DIAGNOSIS — R7303 Prediabetes: Secondary | ICD-10-CM | POA: Diagnosis not present

## 2023-01-04 DIAGNOSIS — Z483 Aftercare following surgery for neoplasm: Secondary | ICD-10-CM | POA: Diagnosis not present

## 2023-01-04 DIAGNOSIS — E039 Hypothyroidism, unspecified: Secondary | ICD-10-CM | POA: Diagnosis not present

## 2023-01-04 DIAGNOSIS — Z436 Encounter for attention to other artificial openings of urinary tract: Secondary | ICD-10-CM | POA: Diagnosis not present

## 2023-01-04 DIAGNOSIS — C61 Malignant neoplasm of prostate: Secondary | ICD-10-CM | POA: Diagnosis not present

## 2023-01-26 DIAGNOSIS — Z436 Encounter for attention to other artificial openings of urinary tract: Secondary | ICD-10-CM | POA: Diagnosis not present

## 2023-01-26 DIAGNOSIS — Z936 Other artificial openings of urinary tract status: Secondary | ICD-10-CM | POA: Diagnosis not present

## 2023-02-25 ENCOUNTER — Ambulatory Visit (INDEPENDENT_AMBULATORY_CARE_PROVIDER_SITE_OTHER): Payer: Medicare HMO | Admitting: Emergency Medicine

## 2023-02-25 ENCOUNTER — Other Ambulatory Visit: Payer: Self-pay | Admitting: Emergency Medicine

## 2023-02-25 ENCOUNTER — Encounter: Payer: Self-pay | Admitting: Emergency Medicine

## 2023-02-25 VITALS — BP 120/88 | HR 95 | Temp 97.9°F | Ht 63.0 in | Wt 157.2 lb

## 2023-02-25 DIAGNOSIS — E039 Hypothyroidism, unspecified: Secondary | ICD-10-CM

## 2023-02-25 DIAGNOSIS — E785 Hyperlipidemia, unspecified: Secondary | ICD-10-CM | POA: Diagnosis not present

## 2023-02-25 DIAGNOSIS — C678 Malignant neoplasm of overlapping sites of bladder: Secondary | ICD-10-CM | POA: Diagnosis not present

## 2023-02-25 LAB — COMPREHENSIVE METABOLIC PANEL
ALT: 18 U/L (ref 0–53)
AST: 16 U/L (ref 0–37)
Albumin: 3.3 g/dL — ABNORMAL LOW (ref 3.5–5.2)
Alkaline Phosphatase: 106 U/L (ref 39–117)
BUN: 22 mg/dL (ref 6–23)
CO2: 25 mEq/L (ref 19–32)
Calcium: 8.9 mg/dL (ref 8.4–10.5)
Chloride: 100 mEq/L (ref 96–112)
Creatinine, Ser: 1.21 mg/dL (ref 0.40–1.50)
GFR: 55.18 mL/min — ABNORMAL LOW (ref >60.00)
Glucose, Bld: 101 mg/dL — ABNORMAL HIGH (ref 70–99)
Potassium: 4.7 mEq/L (ref 3.5–5.1)
Sodium: 133 mEq/L — ABNORMAL LOW (ref 135–145)
Total Bilirubin: 0.5 mg/dL (ref 0.2–1.2)
Total Protein: 7.4 g/dL (ref 6.0–8.3)

## 2023-02-25 LAB — CBC WITH DIFFERENTIAL/PLATELET
Basophils Absolute: 0.1 10*3/uL (ref 0.0–0.1)
Basophils Relative: 0.9 % (ref 0.0–3.0)
Eosinophils Absolute: 0.3 10*3/uL (ref 0.0–0.7)
Eosinophils Relative: 3.4 % (ref 0.0–5.0)
HCT: 35.5 % — ABNORMAL LOW (ref 39.0–52.0)
Hemoglobin: 11.2 g/dL — ABNORMAL LOW (ref 13.0–17.0)
Lymphocytes Relative: 17 % (ref 12.0–46.0)
Lymphs Abs: 1.3 10*3/uL (ref 0.7–4.0)
MCHC: 31.6 g/dL (ref 30.0–36.0)
MCV: 75.6 fl — ABNORMAL LOW (ref 78.0–100.0)
Monocytes Absolute: 0.6 10*3/uL (ref 0.1–1.0)
Monocytes Relative: 8.2 % (ref 3.0–12.0)
Neutro Abs: 5.5 10*3/uL (ref 1.4–7.7)
Neutrophils Relative %: 70.5 % (ref 43.0–77.0)
Platelets: 516 10*3/uL — ABNORMAL HIGH (ref 150.0–400.0)
RBC: 4.69 Mil/uL (ref 4.22–5.81)
RDW: 20.3 % — ABNORMAL HIGH (ref 11.5–15.5)
WBC: 7.8 10*3/uL (ref 4.0–10.5)

## 2023-02-25 LAB — LIPID PANEL
Cholesterol: 105 mg/dL (ref 0–200)
HDL: 30.1 mg/dL — ABNORMAL LOW (ref >39.00)
LDL Cholesterol: 54 mg/dL (ref 0–99)
NonHDL: 74.87
Total CHOL/HDL Ratio: 3
Triglycerides: 106 mg/dL (ref 0.0–149.0)
VLDL: 21.2 mg/dL (ref 0.0–40.0)

## 2023-02-25 LAB — TSH: TSH: 17.87 u[IU]/mL — ABNORMAL HIGH (ref 0.35–5.50)

## 2023-02-25 MED ORDER — LEVOTHYROXINE SODIUM 75 MCG PO TABS: 75.0000 ug | ORAL_TABLET | Freq: Every day | ORAL | 3 refills | Status: DC

## 2023-02-25 NOTE — Progress Notes (Signed)
Horald Pollen 82 y.o.   Chief Complaint  Patient presents with   Medical Management of Chronic Issues    f/u appt, no concerns     HISTORY OF PRESENT ILLNESS: This is a 82 y.o. male here for 53-month follow-up of chronic medical conditions Last office visit December 2023 Diagnosed with bladder cancer.  Had subsequent surgery.  Doing well.  Recovering well. Has no complaints or any other medical concerns today.  HPI   Prior to Admission medications   Medication Sig Start Date End Date Taking? Authorizing Provider  atorvastatin (LIPITOR) 10 MG tablet Take 1 tablet (10 mg total) by mouth daily. 05/28/22  Yes Krisi Azua, Eilleen Kempf, MD  levothyroxine (SYNTHROID) 75 MCG tablet Take 1 tablet (75 mcg total) by mouth daily before breakfast. Patient not taking: Reported on 09/02/2022 05/28/22   Georgina Quint, MD  oxyCODONE-acetaminophen (PERCOCET) 5-325 MG tablet Take 1 tablet by mouth every 6 (six) hours as needed for severe pain or moderate pain (post-operatively. (please label in spanish)). Patient not taking: Reported on 02/25/2023 11/10/22 11/10/23  Loletta Parish., MD  senna-docusate (SENOKOT-S) 8.6-50 MG tablet Take 1 tablet by mouth 2 (two) times daily. While taking strong pain meds to prevent constipation (please label in Spanish) Patient not taking: Reported on 02/25/2023 11/10/22   Loletta Parish., MD    No Known Allergies  Patient Active Problem List   Diagnosis Date Noted   Encounter for ostomy care education 11/03/2022   Encounter for health education 11/03/2022   Bladder cancer (HCC) 09/01/2022   Malignant neoplasm of overlapping sites of bladder (HCC) 08/27/2022   Hypothyroidism 05/28/2022   Dyslipidemia 04/06/2019   Benign prostatic hyperplasia without lower urinary tract symptoms 04/06/2019   Elevated blood pressure reading 04/06/2019    Past Medical History:  Diagnosis Date   Asthma    hx of, currently no symptoms   BPH (benign prostatic  hyperplasia)    Follows with PCP, Dr. Eilleen Kempf @ [facility], [place] 08/27/22.   COVID-19    2020 or 2021, no hospitalization   HLD (hyperlipidemia)    Follows w/ PCP, Dr. Eilleen Kempf @ [facility], [place] 08/27/22.   Hypothyroid    Follows w/ PCP, Dr. Eilleen Kempf @ [facility], [place] 08/27/22.   Pre-diabetes    01/29/22 HgbA1c 6.3   Wears glasses     Past Surgical History:  Procedure Laterality Date   CYSTOSCOPY  07/2022   CYSTOSCOPY WITH INJECTION N/A 11/04/2022   Procedure: CYSTOSCOPY WITH INJECTION OF INDOCYANINE GREEN DYE urethral dilation;  Surgeon: Sebastian Ache, MD;  Location: [facility];  Service: Urology;  Laterality: N/A;   FINGER SURGERY Left 1997   middle finger   HERNIA REPAIR Bilateral 2016   LYMPH NODE DISSECTION Bilateral 11/04/2022   Procedure: PELVIC LYMPH NODE DISSECTION;  Surgeon: Sebastian Ache, MD;  Location: [facility];  Service: Urology;  Laterality: Bilateral;   ROBOT ASSISTED LAPAROSCOPIC COMPLETE CYSTECT ILEAL CONDUIT N/A 11/04/2022   Procedure: XI ROBOTIC ASSISTED LAPAROSCOPIC COMPLETE CYSTECT ILEAL CONDUIT;  Surgeon: Sebastian Ache, MD;  Location: [facility];  Service: Urology;  Laterality: N/A;   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   Procedure: TOTAL HIP ARTHROPLASTY ANTERIOR APPROACH;  Surgeon: Kennedy Bucker, MD;  Location: [facility];  Service: Orthopedics;  Laterality: Left;   TRANSURETHRAL RESECTION OF BLADDER TUMOR N/A 09/01/2022   Procedure: TRANSURETHRAL RESECTION OF BLADDER TUMOR (TURBT);  Surgeon: Belva Agee, MD;  Location: [facility];  Service:  Urology;  Laterality: N/A;   TRANSURETHRAL RESECTION OF PROSTATE N/A 09/01/2022   Procedure: TRANSURETHRAL RESECTION OF THE PROSTATE (TURP);  Surgeon: Belva Agee, MD;  Location: [facility];  Service: Urology;  Laterality: N/A;    Social History   Socioeconomic History   Marital status: Married    Spouse name: Not on file   Number of children: 2   Years of  education: Not on file   Highest education level: Not on file  Occupational History   Not on file  Tobacco Use   Smoking status: Former    Types: Cigarettes    Quit date: 06/15/1964    Years since quitting: 58.7   Smokeless tobacco: Never  Vaping Use   Vaping Use: Never used  Substance and Sexual Activity   Alcohol use: No   Drug use: No   Sexual activity: Not Currently  Other Topics Concern   Not on file  Social History Narrative   Not on file   Social Determinants of Health   Financial Resource Strain: Not on file  Food Insecurity: No Food Insecurity (11/03/2022)   Hunger Vital Sign    Worried About Running Out of Food in the Last Year: Never true    Ran Out of Food in the Last Year: Never true  Transportation Needs: No Transportation Needs (11/03/2022)   PRAPARE - Administrator, Civil Service (Medical): No    Lack of Transportation (Non-Medical): No  Physical Activity: Not on file  Stress: Not on file  Social Connections: Not on file  Intimate Partner Violence: Not At Risk (11/03/2022)   Humiliation, Afraid, Rape, and Kick questionnaire    Fear of Current or Ex-Partner: No    Emotionally Abused: No    Physically Abused: No    Sexually Abused: No    No family history on file.   Review of Systems  Constitutional: Negative.  Negative for chills and fever.  HENT: Negative.  Negative for congestion and sore throat.   Respiratory: Negative.  Negative for cough and shortness of breath.   Cardiovascular: Negative.  Negative for chest pain and palpitations.  Gastrointestinal:  Negative for abdominal pain, diarrhea, nausea and vomiting.  Genitourinary: Negative.   Skin: Negative.  Negative for rash.  Neurological: Negative.  Negative for dizziness and headaches.  All other systems reviewed and are negative.   Vitals:   02/25/23 1317  BP: 120/88  Pulse: 95  Temp: 97.9 F (36.6 C)  SpO2: 98%    Physical Exam Vitals reviewed.  Constitutional:       Appearance: Normal appearance.  HENT:     Head: Normocephalic.     Mouth/Throat:     Mouth: Mucous membranes are moist.     Pharynx: Oropharynx is clear.  Eyes:     Extraocular Movements: Extraocular movements intact.     Conjunctiva/sclera: Conjunctivae normal.     Pupils: Pupils are equal, round, and reactive to light.  Cardiovascular:     Rate and Rhythm: Normal rate and regular rhythm.     Pulses: Normal pulses.     Heart sounds: Normal heart sounds.  Pulmonary:     Effort: Pulmonary effort is normal.     Breath sounds: Normal breath sounds.  Abdominal:     General: Bowel sounds are normal.     Palpations: Abdomen is soft.     Comments: Urostomy bag in place.  Draining yellow urine.  No blood  Musculoskeletal:     Cervical  back: No tenderness.     Right lower leg: No edema.     Left lower leg: No edema.  Lymphadenopathy:     Cervical: No cervical adenopathy.  Skin:    General: Skin is warm and dry.     Capillary Refill: Capillary refill takes less than 2 seconds.  Neurological:     General: No focal deficit present.     Mental Status: He is alert and oriented to person, place, and time.      ASSESSMENT & PLAN: A total of 42 minutes was spent with the patient and counseling/coordination of care regarding preparing for this visit, review of most recent office visit notes, review of multiple chronic medical conditions under management, review of all medications, review of most recent blood work results, education on nutrition, prognosis, documentation and need for follow-up.  Problem List Items Addressed This Visit       Endocrine   Hypothyroidism    Clinically euthyroid TSH done today Not taking Synthroid.  Unknown reason.      Relevant Orders   TSH     Genitourinary   Malignant neoplasm of overlapping sites of bladder (HCC) - Primary    Clinical stable.  Status post surgery with good results Has follow-up appointment next August with urology      Relevant  Orders   CBC with Differential/Platelet   Lipid panel     Other   Dyslipidemia    Chronic stable condition Diet and nutrition discussed Lipid profile done today Continue atorvastatin 10 mg daily      Relevant Orders   Comprehensive metabolic panel   CBC with Differential/Platelet   Lipid panel   Patient Instructions  Mantenimiento de la salud despus de los 65 aos de edad Health Maintenance After Age 51 Despus de los 65 aos de edad, corre un riesgo mayor de Film/video editor enfermedades e infecciones a Air cabin crew, como tambin de sufrir lesiones por cadas. Las cadas son la causa principal de las fracturas de huesos y lesiones en la cabeza de personas mayores de 65 aos de edad. Recibir cuidados preventivos de forma regular puede ayudarlo a mantenerse saludable y en buen [place]. Los cuidados preventivos incluyen realizarse anlisis de forma regular y Forensic psychologist en el estilo de vida segn las recomendaciones del mdico. Converse con el mdico sobre lo siguiente: Las pruebas de deteccin y los anlisis que debe International aid/development worker. Una prueba de deteccin es un estudio que se para Engineer, manufacturing la presencia de una enfermedad cuando no tiene sntomas. Un plan de dieta y ejercicios adecuado para usted. Qu debo saber sobre las pruebas de deteccin y los anlisis para prevenir cadas? Realizarse pruebas de deteccin y [employer] es la mejor manera de Engineer, manufacturing un problema de salud de forma temprana. El diagnstico y tratamiento tempranos le brindan la mejor oportunidad de Chief Operating Officer las afecciones mdicas que son comunes despus de los 65 aos de edad. Ciertas afecciones y elecciones de estilo de vida pueden hacer que sea ms propenso a sufrir Engineer, site. El mdico puede recomendarle lo siguiente: Controles regulares de la visin. Una visin deficiente y afecciones como las cataratas pueden hacer que sea ms propenso a sufrir Engineer, site. Si [place] lentes, asegrese de obtener una receta actualizada si su  visin cambia. Revisin de medicamentos. Revise regularmente con el mdico todos los medicamentos que toma, incluidos los medicamentos de [place]. Consulte al [employer] efectos secundarios que pueden hacer que sea ms propenso a sufrir Engineer, site. Informe al mdico  si alguno de los medicamentos que toma lo hace sentir mareado o somnoliento. Controles de fuerza y equilibrio. El mdico puede recomendar ciertos estudios para controlar su fuerza y equilibrio al estar de pie, al caminar o al cambiar de posicin. Examen de los pies. El dolor y Materials engineer en los pies, como tambin no utilizar el calzado [place], pueden hacer que sea ms propenso a sufrir Engineer, site. Pruebas de deteccin, que incluyen las siguientes: Pruebas de deteccin para la osteoporosis. La osteoporosis es una afeccin que hace que los huesos se tornen ms dbiles y se quiebren con ms facilidad. Pruebas de deteccin para la presin arterial. Los cambios en la presin arterial y los medicamentos para Chief Operating Officer la presin arterial pueden hacerlo sentir mareado. Prueba de deteccin de la depresin. Es ms probable que sufra una cada si tiene miedo a caerse, se siente deprimido o se siente incapaz de Probation officer. Prueba de deteccin de consumo de alcohol. Beber demasiado alcohol puede afectar su equilibrio y puede hacer que sea ms propenso a sufrir Engineer, site. Siga estas indicaciones en su casa: Estilo de vida No beba alcohol si: Su mdico le indica no hacerlo. Si bebe alcohol: Limite la cantidad que bebe a lo siguiente: De 0 a 1 medida por da para las mujeres. De 0 a 2 medidas por da para los hombres. Sepa cunta cantidad de alcohol hay en las bebidas que toma. En los [address], una medida equivale a una botella de cerveza de 12 oz (355 ml), un vaso de vino de 5 oz (148 ml) o un vaso de una bebida alcohlica de alta graduacin de 1 oz (44 ml). No consuma ningn producto que contenga  nicotina o tabaco. Estos productos incluyen cigarrillos, tabaco para Theatre manager y aparatos de vapeo, como los Administrator, Civil Service. Si necesita ayuda para dejar de consumir estos productos, consulte al [employer]. Actividad  Siga un programa de ejercicio regular para mantenerse en forma. Esto lo ayudar a Radio producer equilibrio. Consulte al mdico qu tipos de ejercicios son adecuados para usted. Si necesita un bastn o un andador, selo segn las recomendaciones del mdico. Utilice calzado con buen apoyo y suela antideslizante. Seguridad  Retire los [employer] puedan causar tropiezos tales como alfombras, cables u obstculos. Instale equipos de seguridad, como barras para sostn en los baos y barandas de seguridad en las escaleras. Mantenga las habitaciones y los pasillos bien iluminados. Indicaciones generales Hable con el mdico sobre sus riesgos de sufrir una cada. Infrmele a su mdico si: Se cae. Asegrese de informarle a su mdico acerca de todas las cadas, incluso aquellas que parecen ser [employer]. Se siente mareado, cansado (tiene fatiga) o siente que pierde el equilibrio. Use los medicamentos de venta libre y los recetados solamente como se lo haya indicado el mdico. Estos incluyen suplementos. Siga una dieta sana y [place] un peso saludable. Una dieta saludable incluye productos lcteos descremados, carnes bajas en contenido de grasa ([place]), fibra de granos enteros, frijoles y [place] frutas y verduras. Mantngase al da con las vacunas. Realcese los estudios de rutina de la salud, dentales y de Wellsite geologist. Resumen Tener un estilo de vida saludable y recibir cuidados preventivos pueden ayudar a Research scientist (physical sciences) salud y el bienestar despus de los 65 aos de [place]. Realizarse pruebas de deteccin y [employer] es la mejor manera de Engineer, manufacturing un problema de salud de forma temprana y [place] a Automotive engineer una cada. El diagnstico y tratamiento tempranos le brindan la mejor oportunidad de Chief Operating Officer  las afecciones mdicas ms comunes en las [employer] de 65 aos de edad. Las cadas son la causa principal de las fracturas de huesos y lesiones en la cabeza de personas mayores de 65 aos de edad. Tome precauciones para evitar una cada en su casa. Trabaje con el mdico para saber qu cambios que puede hacer para mejorar su salud y [place], y para prevenir las cadas. Esta informacin no tiene Theme park manager el consejo del mdico. Asegrese de hacerle al mdico cualquier pregunta que tenga. Document Revised: 01/22/2021 Document Reviewed: 01/22/2021 Elsevier Patient Education  2024 Elsevier Inc.    Edwina Barth, MD [facility]

## 2023-02-25 NOTE — Assessment & Plan Note (Signed)
Chronic stable condition Diet and nutrition discussed Lipid profile done today Continue atorvastatin 10 mg daily

## 2023-02-25 NOTE — Assessment & Plan Note (Signed)
Clinically euthyroid TSH done today Not taking Synthroid.  Unknown reason.

## 2023-02-25 NOTE — Assessment & Plan Note (Signed)
Clinical stable.  Status post surgery with good results Has follow-up appointment next August with urology

## 2023-02-25 NOTE — Patient Instructions (Signed)
Mantenimiento de la salud despus de los 65 aos de edad Health Maintenance After Age 84 Despus de los 65 aos de edad, corre un riesgo mayor de padecer ciertas enfermedades e infecciones a largo plazo, como tambin de sufrir lesiones por cadas. Las cadas son la causa principal de las fracturas de huesos y lesiones en la cabeza de personas mayores de 65 aos de edad. Recibir cuidados preventivos de forma regular puede ayudarlo a mantenerse saludable y en buen estado. Los cuidados preventivos incluyen realizarse anlisis de forma regular y realizar cambios en el estilo de vida segn las recomendaciones del mdico. Converse con el mdico sobre lo siguiente: Las pruebas de deteccin y los anlisis que debe realizarse. Una prueba de deteccin es un estudio que se para detectar la presencia de una enfermedad cuando no tiene sntomas. Un plan de dieta y ejercicios adecuado para usted. Qu debo saber sobre las pruebas de deteccin y los anlisis para prevenir cadas? Realizarse pruebas de deteccin y anlisis es la mejor manera de detectar un problema de salud de forma temprana. El diagnstico y tratamiento tempranos le brindan la mejor oportunidad de controlar las afecciones mdicas que son comunes despus de los 65 aos de edad. Ciertas afecciones y elecciones de estilo de vida pueden hacer que sea ms propenso a sufrir una cada. El mdico puede recomendarle lo siguiente: Controles regulares de la visin. Una visin deficiente y afecciones como las cataratas pueden hacer que sea ms propenso a sufrir una cada. Si usa lentes, asegrese de obtener una receta actualizada si su visin cambia. Revisin de medicamentos. Revise regularmente con el mdico todos los medicamentos que toma, incluidos los medicamentos de venta libre. Consulte al mdico sobre los efectos secundarios que pueden hacer que sea ms propenso a sufrir una cada. Informe al mdico si alguno de los medicamentos que toma lo hace sentir mareado o  somnoliento. Controles de fuerza y equilibrio. El mdico puede recomendar ciertos estudios para controlar su fuerza y equilibrio al estar de pie, al caminar o al cambiar de posicin. Examen de los pies. El dolor y el adormecimiento en los pies, como tambin no utilizar el calzado adecuado, pueden hacer que sea ms propenso a sufrir una cada. Pruebas de deteccin, que incluyen las siguientes: Pruebas de deteccin para la osteoporosis. La osteoporosis es una afeccin que hace que los huesos se tornen ms dbiles y se quiebren con ms facilidad. Pruebas de deteccin para la presin arterial. Los cambios en la presin arterial y los medicamentos para controlar la presin arterial pueden hacerlo sentir mareado. Prueba de deteccin de la depresin. Es ms probable que sufra una cada si tiene miedo a caerse, se siente deprimido o se siente incapaz de realizar actividades que sola hacer. Prueba de deteccin de consumo de alcohol. Beber demasiado alcohol puede afectar su equilibrio y puede hacer que sea ms propenso a sufrir una cada. Siga estas indicaciones en su casa: Estilo de vida No beba alcohol si: Su mdico le indica no hacerlo. Si bebe alcohol: Limite la cantidad que bebe a lo siguiente: De 0 a 1 medida por da para las mujeres. De 0 a 2 medidas por da para los hombres. Sepa cunta cantidad de alcohol hay en las bebidas que toma. En los [place], una medida equivale a una botella de cerveza de 12 oz (355 ml), un vaso de vino de 5 oz (148 ml) o un vaso de una bebida alcohlica de alta graduacin de 1 oz (44 ml). No consuma ningn producto que   contenga nicotina o tabaco. Estos productos incluyen cigarrillos, tabaco para mascar y aparatos de vapeo, como los cigarrillos electrnicos. Si necesita ayuda para dejar de consumir estos productos, consulte al mdico. Actividad  Siga un programa de ejercicio regular para mantenerse en forma. Esto lo ayudar a mantener el equilibrio. Consulte al  mdico qu tipos de ejercicios son adecuados para usted. Si necesita un bastn o un andador, selo segn las recomendaciones del mdico. Utilice calzado con buen apoyo y suela antideslizante. Seguridad  Retire los objetos que puedan causar tropiezos tales como alfombras, cables u obstculos. Instale equipos de seguridad, como barras para sostn en los baos y barandas de seguridad en las escaleras. Mantenga las habitaciones y los pasillos bien iluminados. Indicaciones generales Hable con el mdico sobre sus riesgos de sufrir una cada. Infrmele a su mdico si: Se cae. Asegrese de informarle a su mdico acerca de todas las cadas, incluso aquellas que parecen ser menores. Se siente mareado, cansado (tiene fatiga) o siente que pierde el equilibrio. Use los medicamentos de venta libre y los recetados solamente como se lo haya indicado el mdico. Estos incluyen suplementos. Siga una dieta sana y mantenga un peso saludable. Una dieta saludable incluye productos lcteos descremados, carnes bajas en contenido de grasa ([place]), fibra de granos enteros, frijoles y muchas frutas y verduras. Mantngase al da con las vacunas. Realcese los estudios de rutina de la salud, dentales y de la vista. Resumen Tener un estilo de vida saludable y recibir cuidados preventivos pueden ayudar a promover la salud y el bienestar despus de los 65 aos de edad. Realizarse pruebas de deteccin y anlisis es la mejor manera de detectar un problema de salud de forma temprana y ayudarlo a evitar una cada. El diagnstico y tratamiento tempranos le brindan la mejor oportunidad de controlar las afecciones mdicas ms comunes en las personas mayores de 65 aos de edad. Las cadas son la causa principal de las fracturas de huesos y lesiones en la cabeza de personas mayores de 65 aos de edad. Tome precauciones para evitar una cada en su casa. Trabaje con el mdico para saber qu cambios que puede hacer para mejorar su salud y  bienestar, y para prevenir las cadas. Esta informacin no tiene como fin reemplazar el consejo del mdico. Asegrese de hacerle al mdico cualquier pregunta que tenga. Document Revised: 01/22/2021 Document Reviewed: 01/22/2021 Elsevier Patient Education  2024 Elsevier Inc.  

## 2023-02-26 ENCOUNTER — Other Ambulatory Visit: Payer: Self-pay | Admitting: Emergency Medicine

## 2023-02-26 DIAGNOSIS — E785 Hyperlipidemia, unspecified: Secondary | ICD-10-CM

## 2023-03-02 ENCOUNTER — Telehealth: Payer: Self-pay | Admitting: Emergency Medicine

## 2023-03-02 NOTE — Telephone Encounter (Signed)
Pt called wanting the Doctor/ Nurse to over his blood results with him.

## 2023-03-03 NOTE — Telephone Encounter (Signed)
Called patient and left message for patient to call office to get results.  ?

## 2023-04-06 ENCOUNTER — Other Ambulatory Visit (HOSPITAL_COMMUNITY): Payer: Self-pay | Admitting: Urology

## 2023-04-06 ENCOUNTER — Ambulatory Visit (HOSPITAL_COMMUNITY)
Admission: RE | Admit: 2023-04-06 | Discharge: 2023-04-06 | Disposition: A | Discharge status: Home / Self Care | Payer: Medicare HMO | Source: Ambulatory Visit | Attending: Urology | Admitting: Urology

## 2023-04-06 DIAGNOSIS — C678 Malignant neoplasm of overlapping sites of bladder: Secondary | ICD-10-CM

## 2023-04-06 DIAGNOSIS — C679 Malignant neoplasm of bladder, unspecified: Secondary | ICD-10-CM | POA: Diagnosis not present

## 2023-04-15 DIAGNOSIS — N2889 Other specified disorders of kidney and ureter: Secondary | ICD-10-CM | POA: Diagnosis not present

## 2023-04-15 DIAGNOSIS — C678 Malignant neoplasm of overlapping sites of bladder: Secondary | ICD-10-CM | POA: Diagnosis not present

## 2023-04-15 DIAGNOSIS — C679 Malignant neoplasm of bladder, unspecified: Secondary | ICD-10-CM | POA: Diagnosis not present

## 2023-04-15 DIAGNOSIS — C61 Malignant neoplasm of prostate: Secondary | ICD-10-CM | POA: Diagnosis not present

## 2023-04-29 DIAGNOSIS — Z436 Encounter for attention to other artificial openings of urinary tract: Secondary | ICD-10-CM | POA: Diagnosis not present

## 2023-04-29 DIAGNOSIS — C678 Malignant neoplasm of overlapping sites of bladder: Secondary | ICD-10-CM | POA: Diagnosis not present

## 2023-04-29 DIAGNOSIS — C61 Malignant neoplasm of prostate: Secondary | ICD-10-CM | POA: Diagnosis not present

## 2023-04-29 DIAGNOSIS — R8271 Bacteriuria: Secondary | ICD-10-CM | POA: Diagnosis not present

## 2023-04-29 DIAGNOSIS — N130 Hydronephrosis with ureteropelvic junction obstruction: Secondary | ICD-10-CM | POA: Diagnosis not present

## 2023-04-29 DIAGNOSIS — Z936 Other artificial openings of urinary tract status: Secondary | ICD-10-CM | POA: Diagnosis not present

## 2023-05-29 DIAGNOSIS — Z436 Encounter for attention to other artificial openings of urinary tract: Secondary | ICD-10-CM | POA: Diagnosis not present

## 2023-05-29 DIAGNOSIS — Z936 Other artificial openings of urinary tract status: Secondary | ICD-10-CM | POA: Diagnosis not present

## 2023-07-06 DIAGNOSIS — Z936 Other artificial openings of urinary tract status: Secondary | ICD-10-CM | POA: Diagnosis not present

## 2023-07-06 DIAGNOSIS — Z436 Encounter for attention to other artificial openings of urinary tract: Secondary | ICD-10-CM | POA: Diagnosis not present

## 2023-07-22 ENCOUNTER — Ambulatory Visit: Payer: Medicare HMO

## 2023-07-22 VITALS — BP 122/70 | HR 83 | Ht 63.0 in | Wt 156.2 lb

## 2023-07-22 DIAGNOSIS — Z0000 Encounter for general adult medical examination without abnormal findings: Secondary | ICD-10-CM | POA: Diagnosis not present

## 2023-07-22 NOTE — Progress Notes (Signed)
Subjective:   Joel Adkins is a 82 y.o. male who presents for Medicare Annual/Subsequent preventive examination.  Visit Complete: In person    Cardiac Risk Factors include: advanced age (>64men, >35 women);male gender;dyslipidemia;Other (see comment), Risk factor comments: BPH     Objective:    Today's Vitals   07/22/23 1605  BP: 122/70  Pulse: 83  SpO2: 96%  Weight: 156 lb 3.2 oz (70.9 kg)  Height: 5\' 3"  (1.6 m)   Body mass index is 27.67 kg/m.     07/22/2023    3:34 PM 11/03/2022    4:03 PM 09/01/2022   10:23 AM 07/13/2017    7:20 PM 07/13/2017   12:27 PM 06/15/2017   11:22 AM  Advanced Directives  Does Patient Have a Medical Advance Directive? No No No No No No  Would patient like information on creating a medical advance directive?  Yes (Inpatient - patient requests chaplain consult to create a medical advance directive) No - Patient declined Yes (Inpatient - patient requests chaplain consult to create a medical advance directive) No - Patient declined Yes (MAU/Ambulatory/Procedural Areas - Information given)    Current Medications (verified) Outpatient Encounter Medications as of 07/22/2023  Medication Sig   atorvastatin (LIPITOR) 10 MG tablet TAKE 1 TABLET BY MOUTH EVERY DAY   levothyroxine (SYNTHROID) 75 MCG tablet Take 1 tablet (75 mcg total) by mouth daily before breakfast.   oxyCODONE-acetaminophen (PERCOCET) 5-325 MG tablet Take 1 tablet by mouth every 6 (six) hours as needed for severe pain or moderate pain (post-operatively. (please label in spanish)). (Patient not taking: Reported on 02/25/2023)   senna-docusate (SENOKOT-S) 8.6-50 MG tablet Take 1 tablet by mouth 2 (two) times daily. While taking strong pain meds to prevent constipation (please label in Spanish) (Patient not taking: Reported on 02/25/2023)   No facility-administered encounter medications on file as of 07/22/2023.    Allergies (verified) Patient has no known allergies.    History: Past Medical History:  Diagnosis Date   Asthma    hx of, currently no symptoms   BPH (benign prostatic hyperplasia)    Follows with PCP, Dr. Eilleen Kempf @ [facility], LOV 08/27/22.   COVID-19    2020 or 2021, no hospitalization   HLD (hyperlipidemia)    Follows w/ PCP, Dr. Eilleen Kempf @ [facility], LOV 08/27/22.   Hypothyroid    Follows w/ PCP, Dr. Eilleen Kempf @ [facility], LOV 08/27/22.   Pre-diabetes    01/29/22 HgbA1c 6.3   Wears glasses    Past Surgical History:  Procedure Laterality Date   CYSTOSCOPY  07/2022   CYSTOSCOPY WITH INJECTION N/A 11/04/2022   Procedure: CYSTOSCOPY WITH INJECTION OF INDOCYANINE GREEN DYE urethral dilation;  Surgeon: Sebastian Ache, MD;  Location: [facility];  Service: Urology;  Laterality: N/A;   FINGER SURGERY Left 1997   middle finger   HERNIA REPAIR Bilateral 2016   LYMPH NODE DISSECTION Bilateral 11/04/2022   Procedure: PELVIC LYMPH NODE DISSECTION;  Surgeon: Sebastian Ache, MD;  Location: [facility];  Service: Urology;  Laterality: Bilateral;   ROBOT ASSISTED LAPAROSCOPIC COMPLETE CYSTECT ILEAL CONDUIT N/A 11/04/2022   Procedure: XI ROBOTIC ASSISTED LAPAROSCOPIC COMPLETE CYSTECT ILEAL CONDUIT;  Surgeon: Sebastian Ache, MD;  Location: [facility];  Service: Urology;  Laterality: N/A;   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   Procedure: TOTAL HIP ARTHROPLASTY ANTERIOR APPROACH;  Surgeon: Kennedy Bucker, MD;  Location: [facility];  Service: Orthopedics;  Laterality: Left;   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 No pertinent family history. Social History   Socioeconomic History   Marital status: Married    Spouse name: Not on file   Number of children: 2   Years of education: Not on file   Highest education level: Not on file  Occupational History   Occupation: Retired  Tobacco Use   Smoking status: Former    Current packs/day: 0.00    Types: Cigarettes    Quit date: 06/15/1964    Years since quitting: 59.1   Smokeless tobacco: Never  Vaping Use   Vaping status: Never Used  Substance and Sexual Activity   Alcohol use: No   Drug use: No   Sexual activity: Not Currently  Other Topics Concern   Not on file  Social History Narrative   Lives with wife and 1 son.   Social Determinants of Health   Financial Resource Strain: Low Risk  (07/22/2023)   Overall Financial Resource Strain (CARDIA)    Difficulty of Paying Living Expenses: Not hard at all  Food Insecurity: No Food Insecurity (07/22/2023)   Hunger Vital Sign    Worried About Running Out of Food in the Last Year: Never true    Ran Out of Food in the Last Year: Never true  Transportation Needs: No Transportation Needs (07/22/2023)   PRAPARE - Administrator, Civil Service (Medical): No    Lack of Transportation (Non-Medical): No  Physical Activity: Insufficiently Active (07/22/2023)   Exercise Vital Sign    Days of Exercise per Week: 5 days    Minutes of Exercise per Session: 20 min  Stress: No Stress Concern Present (07/22/2023)   [employer] of Occupational Health - Occupational Stress Questionnaire    Feeling of Stress : Not at all  Social Connections: Moderately Integrated (07/22/2023)   Social Connection and Isolation Panel [NHANES]    Frequency of Communication with Friends and Family: Never    Frequency of Social Gatherings with Friends and Family: More than three times a week    Attends Religious Services: More than 4 times per year    Active Member of [employer] or Organizations: No     Attends Engineer, structural: Never    Marital Status: Married    Tobacco Counseling Counseling given: Not Answered   Clinical Intake:  Pre-visit preparation completed: Yes  Pain : No/denies pain     BMI - recorded: 27.67 Nutritional Status: BMI 25 -29 Overweight Nutritional Risks: None Diabetes: No  How often do you need to have someone help you when you read instructions, pamphlets, or other written materials from your doctor or pharmacy?: 5 - Always  Interpreter Needed?: Yes Interpreter Agency: [place] Interpreter Name: Merlene Laughter Patient Declined Interpreter : No Patient signed [place] waiver: Yes  Information entered by :: Syrah Daughtrey, RMA   Activities of Daily Living    07/22/2023    3:32 PM 11/03/2022    4:03 PM  In your present state of health, do you have any difficulty performing the following activities:  Hearing? 0 0  Vision? 0 0  Difficulty concentrating or making decisions?  0 0  Walking or climbing stairs? 0 0  Dressing or bathing? 0 0  Doing errands, shopping? 0 0  Preparing Food and eating ? N   Using the Toilet? N   In the past six months, have you accidently leaked urine? Y   Comment Has urostomy bag   Do you have problems with loss of bowel control? N   Managing your Medications? N   Managing your Finances? N   Housekeeping or managing your Housekeeping? N     Patient Care Team: Georgina Quint, MD as PCP - General (Internal Medicine)  Indicate any recent Medical Services you may have received from other than [facility] providers in the past year (date may be approximate).     Assessment:   This is a routine wellness examination for [place].  Hearing/Vision screen Hearing Screening - Comments:: Denies hearing difficulties   Vision Screening - Comments:: Denies vision issues/Cataract   Goals Addressed   None   Depression Screen    07/22/2023    3:38 PM 02/25/2023    1:18 PM 08/27/2022    1:19 PM 05/28/2022     9:29 AM 01/29/2022    9:27 AM 04/06/2019    9:26 AM  PHQ 2/9 Scores  PHQ - 2 Score 0 0 0 0 0 0  PHQ- 9 Score 0         Fall Risk    07/22/2023    3:35 PM 02/25/2023    1:18 PM 08/27/2022    1:19 PM 05/28/2022    9:28 AM 01/29/2022    9:27 AM  Fall Risk   Falls in the past year? 0 0 0 0 0  Number falls in past yr: 0 0 0 0   Injury with Fall? 0 0 0 0   Risk for fall due to : No Fall Risks History of fall(s) No Fall Risks No Fall Risks   Follow up Falls prevention discussed;Falls evaluation completed Falls evaluation completed Falls evaluation completed Falls evaluation completed     MEDICARE RISK AT HOME: Medicare Risk at Home Any stairs in or around the home?: No Home free of loose throw rugs in walkways, pet beds, electrical cords, etc?: Yes Adequate lighting in your home to reduce risk of falls?: Yes Life alert?: No Use of a cane, walker or w/c?: No Grab bars in the bathroom?: No Shower chair or bench in shower?: No Elevated toilet seat or a handicapped toilet?: No  TIMED UP AND GO:  Was the test performed?  Yes  Length of time to ambulate 10 feet: 15 sec Gait slow and steady without use of assistive device    Cognitive Function:        07/22/2023    4:13 PM  6CIT Screen  Repeat phrase --    Immunizations Immunization History  Administered Date(s) Administered   Fluad Quad(high Dose 65+) 05/28/2022   Influenza, High Dose Seasonal PF 05/20/2018   Influenza,inj,Quad PF,6+ Mos 05/31/2019   Pneumococcal Conjugate-13 04/06/2019   Tdap 04/06/2019    TDAP status: Up to date  Flu Vaccine status: Due, Education has been provided regarding the importance of this vaccine. Advised may receive this vaccine at local pharmacy or Health Dept. Aware to provide a copy of the vaccination record if obtained from local pharmacy or Health Dept. Verbalized acceptance and understanding.  Pneumococcal vaccine status: Due, Education has been provided regarding the importance of this  vaccine. Advised may receive this vaccine at local pharmacy or Health Dept.  Aware to provide a copy of the vaccination record if obtained from local pharmacy or Health Dept. Verbalized acceptance and understanding.  Covid-19 vaccine status: Declined, Education has been provided regarding the importance of this vaccine but patient still declined. Advised may receive this vaccine at local pharmacy or Health Dept.or vaccine clinic. Aware to provide a copy of the vaccination record if obtained from local pharmacy or Health Dept. Verbalized acceptance and understanding.  Qualifies for Shingles Vaccine? Yes   Zostavax completed Yes   Shingrix Completed?: No.    Education has been provided regarding the importance of this vaccine. Patient has been advised to call insurance company to determine out of pocket expense if they have not yet received this vaccine. Advised may also receive vaccine at local pharmacy or Health Dept. Verbalized acceptance and understanding.  Screening Tests Health Maintenance  Topic Date Due   Zoster Vaccines- Shingrix (1 of 2) Never done   Pneumonia Vaccine 60+ Years old (2 of 2 - PPSV23 or PCV20) 04/05/2020   INFLUENZA VACCINE  04/01/2023   COVID-19 Vaccine (1) 08/07/2023 (Originally 02/23/1946)   Medicare Annual Wellness (AWV)  07/21/2024   DTaP/Tdap/Td (2 - Td or Tdap) 04/05/2029   HPV VACCINES  Aged Out    Health Maintenance  Health Maintenance Due  Topic Date Due   Zoster Vaccines- Shingrix (1 of 2) Never done   Pneumonia Vaccine 78+ Years old (2 of 2 - PPSV23 or PCV20) 04/05/2020   INFLUENZA VACCINE  04/01/2023    Colorectal cancer screening: No longer required.   Lung Cancer Screening: (Low Dose CT Chest recommended if Age 81-80 years, 20 pack-year currently smoking OR have quit w/in 15years.) does not qualify.   Lung Cancer Screening Referral: N/A  Additional Screening:  Hepatitis C Screening: does not qualify;   Vision Screening: Recommended annual  ophthalmology exams for early detection of glaucoma and other disorders of the eye. Is the patient up to date with their annual eye exam?  No  Who is the provider or what is the name of the office in which the patient attends annual eye exams? N/A If pt is not established with a provider, would they like to be referred to a provider to establish care? Yes .   Dental Screening: Recommended annual dental exams for proper oral hygiene   Community Resource Referral / Chronic Care Management: CRR required this visit?  No   CCM required this visit?  No     Plan:     I have personally reviewed and noted the following in the patient's chart:   Medical and social history Use of alcohol, tobacco or illicit drugs  Current medications and supplements including opioid prescriptions. Patient is not currently taking opioid prescriptions. Functional ability and status Nutritional status Physical activity Advanced directives List of other physicians Hospitalizations, surgeries, and ER visits in previous 12 months Vitals Screenings to include cognitive, depression, and falls Referrals and appointments  In addition, I have reviewed and discussed with patient certain preventive protocols, quality metrics, and best practice recommendations. A written personalized care plan for preventive services as well as general preventive health recommendations were provided to patient.     Damonica Chopra L Laquitta Dominski, CMA   07/22/2023   After Visit Summary: (Mail) Due to this being a telephonic visit, the after visit summary with patients personalized plan was offered to patient via mail   Nurse Notes: Patient is due for a second Pneumonia vaccine.  He states that he has had the Shingrix  and the Flu vaccine this fall at [facility], however it is not documented in [place].  Patient is here with his son today and an interpreter.  Patient has concerns about communication in regards to his medical care.  He explains that he would  like things explained to him in detail or in written documentation, so he can understand what is going on with his health and or what he needs to do while at home to stay healthy.  He also would like to be recommended an eye doctor during his next visit with Dr. Alvy Bimler.  Patient had no other concerns to address today.

## 2023-07-22 NOTE — Patient Instructions (Addendum)
Joel Adkins , Thank you for taking time to come for your Medicare Wellness Visit. I appreciate your ongoing commitment to your health goals. Please review the following plan we discussed and let me know if I can assist you in the future.   Referrals/Orders/Follow-Ups/Clinician Recommendations: You are due for a second Pneumonia vaccine (PPSV23) and also a second Shingrix vaccine.  Please ask [facility] to put documentation of injections in the [facility].  I have request for you to have more detailed information during or after your visit, verbally and on paper.  It was nice to meet you and your son today. Keep up the good work.  This is a list of the screening recommended for you and due dates:  Health Maintenance  Topic Date Due   Zoster (Shingles) Vaccine (1 of 2) Never done   Pneumonia Vaccine (2 of 2 - PPSV23 or PCV20) 04/05/2020   Flu Shot  04/01/2023   COVID-19 Vaccine (1) 08/07/2023*   Medicare Annual Wellness Visit  07/21/2024   DTaP/Tdap/Td vaccine (2 - Td or Tdap) 04/05/2029   HPV Vaccine  Aged Out  *Topic was postponed. The date shown is not the original due date.    Advanced directives: (Copy Requested) Please bring a copy of your health care power of attorney and living will to the office to be added to your chart at your convenience.  Next Medicare Annual Wellness Visit scheduled for next year: Yes

## 2023-08-04 DIAGNOSIS — Z936 Other artificial openings of urinary tract status: Secondary | ICD-10-CM | POA: Diagnosis not present

## 2023-08-04 DIAGNOSIS — Z436 Encounter for attention to other artificial openings of urinary tract: Secondary | ICD-10-CM | POA: Diagnosis not present

## 2023-09-02 ENCOUNTER — Ambulatory Visit: Payer: Medicare HMO | Admitting: Emergency Medicine

## 2023-09-04 DIAGNOSIS — Z936 Other artificial openings of urinary tract status: Secondary | ICD-10-CM | POA: Diagnosis not present

## 2023-09-04 DIAGNOSIS — Z436 Encounter for attention to other artificial openings of urinary tract: Secondary | ICD-10-CM | POA: Diagnosis not present

## 2023-09-09 ENCOUNTER — Ambulatory Visit (INDEPENDENT_AMBULATORY_CARE_PROVIDER_SITE_OTHER): Payer: Medicare HMO | Admitting: Emergency Medicine

## 2023-09-09 ENCOUNTER — Encounter: Payer: Self-pay | Admitting: Emergency Medicine

## 2023-09-09 VITALS — BP 128/70 | HR 83 | Temp 98.1°F | Ht 63.0 in | Wt 158.0 lb

## 2023-09-09 DIAGNOSIS — C679 Malignant neoplasm of bladder, unspecified: Secondary | ICD-10-CM | POA: Diagnosis not present

## 2023-09-09 DIAGNOSIS — E039 Hypothyroidism, unspecified: Secondary | ICD-10-CM | POA: Diagnosis not present

## 2023-09-09 DIAGNOSIS — E785 Hyperlipidemia, unspecified: Secondary | ICD-10-CM | POA: Diagnosis not present

## 2023-09-09 DIAGNOSIS — H579 Unspecified disorder of eye and adnexa: Secondary | ICD-10-CM | POA: Diagnosis not present

## 2023-09-09 LAB — CBC WITH DIFFERENTIAL/PLATELET
Basophils Absolute: 0 10*3/uL (ref 0.0–0.1)
Basophils Relative: 0.5 % (ref 0.0–3.0)
Eosinophils Absolute: 0.1 10*3/uL (ref 0.0–0.7)
Eosinophils Relative: 2.7 % (ref 0.0–5.0)
HCT: 36.7 % — ABNORMAL LOW (ref 39.0–52.0)
Hemoglobin: 11.6 g/dL — ABNORMAL LOW (ref 13.0–17.0)
Lymphocytes Relative: 28.3 % (ref 12.0–46.0)
Lymphs Abs: 1.2 10*3/uL (ref 0.7–4.0)
MCHC: 31.6 g/dL (ref 30.0–36.0)
MCV: 81.2 fL (ref 78.0–100.0)
Monocytes Absolute: 0.4 10*3/uL (ref 0.1–1.0)
Monocytes Relative: 9.6 % (ref 3.0–12.0)
Neutro Abs: 2.5 10*3/uL (ref 1.4–7.7)
Neutrophils Relative %: 58.9 % (ref 43.0–77.0)
Platelets: 250 10*3/uL (ref 150.0–400.0)
RBC: 4.52 Mil/uL (ref 4.22–5.81)
RDW: 16.6 % — ABNORMAL HIGH (ref 11.5–15.5)
WBC: 4.3 10*3/uL (ref 4.0–10.5)

## 2023-09-09 LAB — COMPREHENSIVE METABOLIC PANEL
ALT: 11 U/L (ref 0–53)
AST: 18 U/L (ref 0–37)
Albumin: 3.6 g/dL (ref 3.5–5.2)
Alkaline Phosphatase: 108 U/L (ref 39–117)
BUN: 21 mg/dL (ref 6–23)
CO2: 27 meq/L (ref 19–32)
Calcium: 9 mg/dL (ref 8.4–10.5)
Chloride: 103 meq/L (ref 96–112)
Creatinine, Ser: 1.38 mg/dL (ref 0.40–1.50)
GFR: 46.95 mL/min — ABNORMAL LOW (ref >60.00)
Glucose, Bld: 95 mg/dL (ref 70–99)
Potassium: 4.7 meq/L (ref 3.5–5.1)
Sodium: 136 meq/L (ref 135–145)
Total Bilirubin: 0.6 mg/dL (ref 0.2–1.2)
Total Protein: 7.7 g/dL (ref 6.0–8.3)

## 2023-09-09 LAB — TSH: TSH: 4.25 u[IU]/mL (ref 0.35–5.50)

## 2023-09-09 LAB — URINALYSIS, ROUTINE W REFLEX MICROSCOPIC
Bilirubin Urine: NEGATIVE
Ketones, ur: NEGATIVE
Nitrite: POSITIVE — AB
Specific Gravity, Urine: 1.015 (ref 1.000–1.030)
Urine Glucose: NEGATIVE
Urobilinogen, UA: 0.2 (ref 0.0–1.0)
pH: 6.5 (ref 5.0–8.0)

## 2023-09-09 LAB — HEMOGLOBIN A1C: Hgb A1c MFr Bld: 6.6 % — ABNORMAL HIGH (ref 4.6–6.5)

## 2023-09-09 LAB — LIPID PANEL
Cholesterol: 125 mg/dL (ref 0–200)
HDL: 41.5 mg/dL (ref >39.00)
LDL Cholesterol: 66 mg/dL (ref 0–99)
NonHDL: 83.74
Total CHOL/HDL Ratio: 3
Triglycerides: 87 mg/dL (ref 0.0–149.0)
VLDL: 17.4 mg/dL (ref 0.0–40.0)

## 2023-09-09 NOTE — Assessment & Plan Note (Signed)
 Clinically stable.  No concerns. Needs to follow-up with urologist Will check urine for infection Urinalysis and urine culture done today

## 2023-09-09 NOTE — Progress Notes (Signed)
 Joel Adkins 83 y.o.   Chief Complaint  Patient presents with   Follow-up    6 month f/u.    HISTORY OF PRESENT ILLNESS: This is a 83 y.o. male here for 94-month follow-up of chronic medical conditions History of bladder cancer.  Has urostomy bag.  Concerned about possible infection. No other complaints or medical concerns today Overall doing well. Requesting referral to ophthalmologist for visual concerns  HPI   Prior to Admission medications   Medication Sig Start Date End Date Taking? Authorizing Provider  atorvastatin  (LIPITOR) 10 MG tablet TAKE 1 TABLET BY MOUTH EVERY DAY 02/26/23  Yes Mckinze Poirier, Emil Schanz, MD  levothyroxine  (SYNTHROID ) 75 MCG tablet Take 1 tablet (75 mcg total) by mouth daily before breakfast. 02/25/23  Yes Shawny Borkowski, Emil Schanz, MD  oxyCODONE -acetaminophen  (PERCOCET) 5-325 MG tablet Take 1 tablet by mouth every 6 (six) hours as needed for severe pain or moderate pain (post-operatively. (please label in spanish)). Patient not taking: Reported on 09/09/2023 11/10/22 11/10/23  Alvaro Ricardo KATHEE Mickey., MD  senna-docusate (SENOKOT-S) 8.6-50 MG tablet Take 1 tablet by mouth 2 (two) times daily. While taking strong pain meds to prevent constipation (please label in Spanish) Patient not taking: Reported on 09/09/2023 11/10/22   Alvaro Ricardo KATHEE Mickey., MD    No Known Allergies  Patient Active Problem List   Diagnosis Date Noted   Encounter for ostomy care education 11/03/2022   Encounter for health education 11/03/2022   Bladder cancer (HCC) 09/01/2022   Malignant neoplasm of overlapping sites of bladder (HCC) 08/27/2022   Hypothyroidism 05/28/2022   Dyslipidemia 04/06/2019   Benign prostatic hyperplasia without lower urinary tract symptoms 04/06/2019   Elevated blood pressure reading 04/06/2019    Past Medical History:  Diagnosis Date   Asthma    hx of, currently no symptoms   BPH (benign prostatic hyperplasia)    Follows with PCP, Dr. Emil Schanz @ [facility], LOV 08/27/22.   COVID-19    2020 or 2021, no hospitalization   HLD (hyperlipidemia)    Follows w/ PCP, Dr. Emil Schanz @ [facility], LOV 08/27/22.   Hypothyroid    [facility] 08/27/22.   Pre-diabetes    01/29/22 HgbA1c 6.3   Wears glasses     Past Surgical History:  Procedure Laterality Date   CYSTOSCOPY  07/2022   CYSTOSCOPY WITH INJECTION N/A 11/04/2022   Procedure: CYSTOSCOPY WITH INJECTION OF INDOCYANINE GREEN  DYE urethral dilation;  Surgeon: Alvaro Ricardo, MD;  Location: [facility];  Service: Urology;  Laterality: N/A;   FINGER SURGERY Left 1997   middle finger   HERNIA REPAIR Bilateral 2016   LYMPH NODE DISSECTION Bilateral 11/04/2022   Procedure: PELVIC LYMPH NODE DISSECTION;  Surgeon: Alvaro Ricardo, MD;  Location: [facility];  Service: Urology;  Laterality: Bilateral;   ROBOT ASSISTED LAPAROSCOPIC COMPLETE CYSTECT ILEAL CONDUIT N/A 11/04/2022   Procedure: XI ROBOTIC ASSISTED LAPAROSCOPIC COMPLETE CYSTECT ILEAL CONDUIT;  Surgeon: Alvaro Ricardo, MD;  Location: [facility];  Service: Urology;  Laterality: N/A;   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   Procedure: TOTAL HIP ARTHROPLASTY ANTERIOR APPROACH;  Surgeon: Kathlynn Sharper, MD;  Location: [facility];  Service: Orthopedics;  Laterality: Left;   TRANSURETHRAL RESECTION OF BLADDER TUMOR N/A 09/01/2022   Procedure: TRANSURETHRAL RESECTION OF BLADDER TUMOR (TURBT);  Surgeon: Rosalind Zachary KATHEE, MD;  Location: [facility];  Service: Urology;  Laterality: N/A;   TRANSURETHRAL RESECTION OF PROSTATE N/A 09/01/2022  Procedure: TRANSURETHRAL RESECTION OF THE PROSTATE (TURP);  Surgeon: Rosalind Zachary NOVAK, MD;  Location: [facility];  Service: Urology;  Laterality: N/A;    Social History   Socioeconomic History   Marital status: Married    Spouse name: Not on file   Number of children: 2   Years of education: Not on file   Highest education level: Not on  file  Occupational History   Occupation: Retired  Tobacco Use   Smoking status: Former    Current packs/day: 0.00    Types: Cigarettes    Quit date: 06/15/1964    Years since quitting: 59.2   Smokeless tobacco: Never  Vaping Use   Vaping status: Never Used  Substance and Sexual Activity   Alcohol  use: No   Drug use: No   Sexual activity: Not Currently  Other Topics Concern   Not on file  Social History Narrative   Lives with wife and 1 son.   Social Drivers of Corporate Investment Banker Strain: Low Risk  (07/22/2023)   Overall Financial Resource Strain (CARDIA)    Difficulty of Paying Living Expenses: Not hard at all  Food Insecurity: No Food Insecurity (07/22/2023)   Hunger Vital Sign    Worried About Running Out of Food in the Last Year: Never true    Ran Out of Food in the Last Year: Never true  Transportation Needs: No Transportation Needs (07/22/2023)   PRAPARE - Administrator, Civil Service (Medical): No    Lack of Transportation (Non-Medical): No  Physical Activity: Insufficiently Active (07/22/2023)   Exercise Vital Sign    Days of Exercise per Week: 5 days    Minutes of Exercise per Session: 20 min  Stress: No Stress Concern Present (07/22/2023)   [employer] of Occupational Health - Occupational Stress Questionnaire    Feeling of Stress : Not at all  Social Connections: Moderately Integrated (07/22/2023)   Social Connection and Isolation Panel [NHANES]    Frequency of Communication with Friends and Family: Never    Frequency of Social Gatherings with Friends and Family: More than three times a week    Attends Religious Services: More than 4 times per year    Active Member of [employer] or Organizations: No    Attends Banker Meetings: Never    Marital Status: Married  Catering Manager Violence: Not At Risk (07/22/2023)   Humiliation, Afraid, Rape, and Kick questionnaire    Fear of Current or Ex-Partner: No    Emotionally Abused:  No    Physically Abused: No    Sexually Abused: No    No family history on file.   Review of Systems  Constitutional: Negative.  Negative for chills and fever.  HENT: Negative.  Negative for congestion and sore throat.   Respiratory: Negative.  Negative for cough and shortness of breath.   Cardiovascular: Negative.  Negative for chest pain and palpitations.  Gastrointestinal:  Negative for abdominal pain, diarrhea, nausea and vomiting.  Genitourinary: Negative.  Negative for hematuria.  Skin: Negative.  Negative for rash.  Neurological: Negative.  Negative for dizziness and headaches.  All other systems reviewed and are negative.   Vitals:   09/09/23 1031  BP: 128/70  Pulse: 83  Temp: 98.1 F (36.7 C)  SpO2: 97%    Physical Exam Vitals reviewed.  Constitutional:      Appearance: Normal appearance.  HENT:     Head: Normocephalic.     Mouth/Throat:  Mouth: Mucous membranes are moist.     Pharynx: Oropharynx is clear.  Eyes:     Extraocular Movements: Extraocular movements intact.     Conjunctiva/sclera: Conjunctivae normal.     Pupils: Pupils are equal, round, and reactive to light.  Cardiovascular:     Rate and Rhythm: Normal rate and regular rhythm.     Pulses: Normal pulses.     Heart sounds: Normal heart sounds.  Pulmonary:     Effort: Pulmonary effort is normal.     Breath sounds: Normal breath sounds.  Abdominal:     Palpations: Abdomen is soft.     Tenderness: There is no abdominal tenderness.     Comments: Urostomy bag in place.  Cloudy urine with sediment  Musculoskeletal:     Cervical back: No tenderness.     Right lower leg: No edema.     Left lower leg: No edema.  Lymphadenopathy:     Cervical: No cervical adenopathy.  Skin:    Capillary Refill: Capillary refill takes less than 2 seconds.  Neurological:     General: No focal deficit present.     Mental Status: He is alert and oriented to person, place, and time.  Psychiatric:        Mood  and Affect: Mood normal.        Behavior: Behavior normal.      ASSESSMENT & PLAN: A total of 43 minutes was spent with the patient and counseling/coordination of care regarding preparing for this visit, review of most recent office visit notes, review of multiple chronic medical conditions and their management, review of all medications, review of most recent bloodwork results, review of health maintenance items, education on nutrition, prognosis, documentation, and need for follow up.   Problem List Items Addressed This Visit       Endocrine   Hypothyroidism   Clinically euthyroid TSH done today Continue levothyroxine  75 mcg daily      Relevant Orders   Comprehensive metabolic panel   CBC with Differential/Platelet   Hemoglobin A1c   Lipid panel   Urine Culture   Urinalysis   TSH     Genitourinary   Bladder cancer (HCC) - Primary   Clinically stable.  No concerns. Needs to follow-up with urologist Will check urine for infection Urinalysis and urine culture done today      Relevant Orders   Comprehensive metabolic panel   CBC with Differential/Platelet   Hemoglobin A1c   Lipid panel   Urine Culture   Urinalysis   TSH     Other   Dyslipidemia   Chronic stable condition Diet and nutrition discussed Lipid profile done today Continue atorvastatin  10 mg daily      Relevant Orders   Comprehensive metabolic panel   CBC with Differential/Platelet   Hemoglobin A1c   Lipid panel   Urine Culture   Urinalysis   TSH   Other Visit Diagnoses       Visual complaint       Relevant Orders   Ambulatory referral to Ophthalmology      Patient Instructions  Mantenimiento de la salud despus de los 65 aos de edad Health Maintenance After Age 23 Despus de los 65 aos de edad, corre un riesgo mayor de film/video editor enfermedades e infecciones a largo plazo, como tambin de sufrir lesiones por cadas. Las cadas son la causa principal de las fracturas de huesos y  lesiones en la cabeza de personas mayores de 65 aos de edad. Recibir  cuidados preventivos de forma regular puede ayudarlo a mantenerse saludable y en buen [place]. Los cuidados preventivos incluyen realizarse anlisis de forma regular y forensic psychologist en el estilo de vida segn las recomendaciones del mdico. Converse con el mdico sobre lo siguiente: Las pruebas de deteccin y los anlisis que debe international aid/development worker. Una prueba de deteccin es un estudio que se para engineer, manufacturing la presencia de una enfermedad cuando no tiene sntomas. Un plan de dieta y ejercicios adecuado para usted. Qu debo saber sobre las pruebas de deteccin y los anlisis para prevenir cadas? Realizarse pruebas de deteccin y [employer] es la mejor manera de engineer, manufacturing un problema de salud de forma temprana. El diagnstico y tratamiento tempranos le brindan la mejor oportunidad de chief operating officer las afecciones mdicas que son comunes despus de los 65 aos de edad. Ciertas afecciones y elecciones de estilo de vida pueden hacer que sea ms propenso a sufrir engineer, site. El mdico puede recomendarle lo siguiente: Controles regulares de la visin. Una visin deficiente y afecciones como las cataratas pueden hacer que sea ms propenso a sufrir engineer, site. Si usa  lentes, asegrese de obtener una receta actualizada si su visin cambia. Revisin de medicamentos. Revise regularmente con el mdico todos los medicamentos que toma, incluidos los medicamentos de [place]. Consulte al enterprise products efectos secundarios que pueden hacer que sea ms propenso a sufrir engineer, site. Informe al mdico si alguno de los medicamentos que toma lo hace sentir mareado o somnoliento. Controles de fuerza y equilibrio. El mdico puede recomendar ciertos estudios para controlar su fuerza y equilibrio al estar de pie, al caminar o al cambiar de posicin. Examen de los pies. El dolor y materials engineer en los pies, como tambin no utilizar el calzado adecuado, pueden hacer que  sea ms propenso a sufrir engineer, site. Pruebas de deteccin, que incluyen las siguientes: Pruebas de deteccin para la osteoporosis. La osteoporosis es una afeccin que hace que los huesos se tornen ms dbiles y se quiebren con ms facilidad. Pruebas de deteccin para la presin arterial. Los cambios en la presin arterial y los medicamentos para chief operating officer la presin arterial pueden hacerlo sentir mareado. Prueba de deteccin de la depresin. Es ms probable que sufra una cada si tiene miedo a caerse, se siente deprimido o se siente incapaz de probation officer. Prueba de deteccin de consumo de alcohol . Dorisann demasiado alcohol  puede afectar su equilibrio y puede hacer que sea ms propenso a sufrir engineer, site. Siga estas indicaciones en su casa: Estilo de vida No beba alcohol  si: Su mdico le indica no hacerlo. Si bebe alcohol : Limite la cantidad que bebe a lo siguiente: De 0 a 1 medida por da para las mujeres. De 0 a 2 medidas por da para los hombres. Sepa cunta cantidad de alcohol  hay en las bebidas que toma. En los [address], una medida equivale a una botella de cerveza de 12 oz (355 ml), un vaso de vino de 5 oz (148 ml) o un vaso de una bebida alcohlica de alta graduacin de 1 oz (44 ml). No consuma ningn producto que contenga nicotina o tabaco. Estos productos incluyen cigarrillos, tabaco para theatre manager y aparatos de vapeo, como los cigarrillos electrnicos. Si necesita ayuda para dejar de consumir estos productos, consulte al american express. Actividad  Siga un programa de ejercicio regular para mantenerse en forma. Esto lo ayudar a radio producer equilibrio. Consulte al mdico qu tipos de ejercicios son adecuados para usted. Si necesita un bastn o un andador,  selo segn las recomendaciones del mdico. Utilice calzado con buen apoyo y suela antideslizante. Seguridad  Retire los autonation puedan causar tropiezos tales como alfombras, cables u obstculos. Instale  equipos de seguridad, como barras para sostn en los baos y barandas de seguridad en las escaleras. Mantenga las habitaciones y los pasillos bien iluminados. Indicaciones generales Hable con el mdico sobre sus riesgos de sufrir una cada. Infrmele a su mdico si: Se cae. Asegrese de informarle a su mdico acerca de todas las cadas, incluso aquellas que parecen ser liberty global. Se siente mareado, cansado (tiene fatiga) o siente que pierde el equilibrio. Use los medicamentos de venta libre y los recetados solamente como se lo haya indicado el mdico. Estos incluyen suplementos. Siga una dieta sana y [place] un peso saludable. Una dieta saludable incluye productos lcteos descremados, carnes bajas en contenido de grasa ([place]), fibra de granos enteros, frijoles y [place] frutas y verduras. Mantngase al da con las vacunas. Realcese los estudios de rutina de la salud, dentales y de wellsite geologist. Resumen Tener un estilo de vida saludable y recibir cuidados preventivos pueden ayudar a research scientist (physical sciences) salud y el bienestar despus de los 65 aos de [place]. Realizarse pruebas de deteccin y anlisis es la mejor manera de engineer, manufacturing un problema de salud de forma temprana y ayudarlo a automotive engineer una cada. El diagnstico y tratamiento tempranos le brindan la mejor oportunidad de chief operating officer las afecciones mdicas ms comunes en las personas mayores de 65 aos de edad. Las cadas son la causa principal de las fracturas de huesos y lesiones en la cabeza de personas mayores de 65 aos de edad. Tome precauciones para evitar una cada en su casa. Trabaje con el mdico para saber qu cambios que puede hacer para mejorar su salud y [place], y para prevenir las cadas. Esta informacin no tiene theme park manager el consejo del mdico. Asegrese de hacerle al mdico cualquier pregunta que tenga. Document Revised: 01/22/2021 Document Reviewed: 01/22/2021 Elsevier Patient Education  2024 Elsevier Inc.     Emil Schaumann,  MD [facility]

## 2023-09-09 NOTE — Assessment & Plan Note (Signed)
Chronic stable condition Diet and nutrition discussed Lipid profile done today Continue atorvastatin 10 mg daily

## 2023-09-09 NOTE — Assessment & Plan Note (Signed)
Clinically euthyroid.  TSH done today. Continue levothyroxine 75 mcg daily

## 2023-09-09 NOTE — Patient Instructions (Signed)
 Mantenimiento de la salud despus de los 65 aos de edad Health Maintenance After Age 85 Despus de los 65 aos de edad, corre un riesgo mayor de Film/video editor enfermedades e infecciones a largo plazo, como tambin de sufrir lesiones por cadas. Las cadas son la causa principal de las fracturas de huesos y lesiones en la cabeza de personas mayores de 65 aos de edad. Recibir cuidados preventivos de forma regular puede ayudarlo a mantenerse saludable y en buen [place]. Los cuidados preventivos incluyen realizarse anlisis de forma regular y Forensic psychologist en el estilo de vida segn las recomendaciones del mdico. Converse con el mdico sobre lo siguiente: Las pruebas de deteccin y los anlisis que debe International aid/development worker. Una prueba de deteccin es un estudio que se para Engineer, manufacturing la presencia de una enfermedad cuando no tiene sntomas. Un plan de dieta y ejercicios adecuado para usted. Qu debo saber sobre las pruebas de deteccin y los anlisis para prevenir cadas? Realizarse pruebas de deteccin y [employer] es la mejor manera de Engineer, manufacturing un problema de salud de forma temprana. El diagnstico y tratamiento tempranos le brindan la mejor oportunidad de Chief Operating Officer las afecciones mdicas que son comunes despus de los 65 aos de edad. Ciertas afecciones y elecciones de estilo de vida pueden hacer que sea ms propenso a sufrir Engineer, site. El mdico puede recomendarle lo siguiente: Controles regulares de la visin. Una visin deficiente y afecciones como las cataratas pueden hacer que sea ms propenso a sufrir Engineer, site. Si usa  lentes, asegrese de obtener una receta actualizada si su visin cambia. Revisin de medicamentos. Revise regularmente con el mdico todos los medicamentos que toma, incluidos los medicamentos de [place]. Consulte al Enterprise Products efectos secundarios que pueden hacer que sea ms propenso a sufrir Engineer, site. Informe al mdico si alguno de los medicamentos que toma lo hace sentir mareado o  somnoliento. Controles de fuerza y equilibrio. El mdico puede recomendar ciertos estudios para controlar su fuerza y equilibrio al estar de pie, al caminar o al cambiar de posicin. Examen de los pies. El dolor y Materials engineer en los pies, como tambin no utilizar el calzado adecuado, pueden hacer que sea ms propenso a sufrir Engineer, site. Pruebas de deteccin, que incluyen las siguientes: Pruebas de deteccin para la osteoporosis. La osteoporosis es una afeccin que hace que los huesos se tornen ms dbiles y se quiebren con ms facilidad. Pruebas de deteccin para la presin arterial. Los cambios en la presin arterial y los medicamentos para Chief Operating Officer la presin arterial pueden hacerlo sentir mareado. Prueba de deteccin de la depresin. Es ms probable que sufra una cada si tiene miedo a caerse, se siente deprimido o se siente incapaz de Probation officer. Prueba de deteccin de consumo de alcohol. Beber demasiado alcohol puede afectar su equilibrio y puede hacer que sea ms propenso a sufrir Engineer, site. Siga estas indicaciones en su casa: Estilo de vida No beba alcohol si: Su mdico le indica no hacerlo. Si bebe alcohol: Limite la cantidad que bebe a lo siguiente: De 0 a 1 medida por da para las mujeres. De 0 a 2 medidas por da para los hombres. Sepa cunta cantidad de alcohol hay en las bebidas que toma. En los [address], una medida equivale a una botella de cerveza de 12 oz (355 ml), un vaso de vino de 5 oz (148 ml) o un vaso de una bebida alcohlica de alta graduacin de 1 oz (44 ml). No consuma ningn producto que  contenga nicotina o tabaco. Estos productos incluyen cigarrillos, tabaco para mascar y aparatos de vapeo, como los cigarrillos electrnicos. Si necesita ayuda para dejar de consumir estos productos, consulte al [employer]. Actividad  Siga un programa de ejercicio regular para mantenerse en forma. Esto lo ayudar a Radio producer equilibrio. Consulte al  mdico qu tipos de ejercicios son adecuados para usted. Si necesita un bastn o un andador, selo segn las recomendaciones del mdico. Utilice calzado con buen apoyo y suela antideslizante. Seguridad  Retire los [employer] puedan causar tropiezos tales como alfombras, cables u obstculos. Instale equipos de seguridad, como barras para sostn en los baos y barandas de seguridad en las escaleras. Mantenga las habitaciones y los pasillos bien iluminados. Indicaciones generales Hable con el mdico sobre sus riesgos de sufrir una cada. Infrmele a su mdico si: Se cae. Asegrese de informarle a su mdico acerca de todas las cadas, incluso aquellas que parecen ser [employer]. Se siente mareado, cansado (tiene fatiga) o siente que pierde el equilibrio. Use los medicamentos de venta libre y los recetados solamente como se lo haya indicado el mdico. Estos incluyen suplementos. Siga una dieta sana y [place] un peso saludable. Una dieta saludable incluye productos lcteos descremados, carnes bajas en contenido de grasa ([place]), fibra de granos enteros, frijoles y [place] frutas y verduras. Mantngase al da con las vacunas. Realcese los estudios de rutina de la salud, dentales y de Wellsite geologist. Resumen Tener un estilo de vida saludable y recibir cuidados preventivos pueden ayudar a Research scientist (physical sciences) salud y el bienestar despus de los 65 aos de [place]. Realizarse pruebas de deteccin y anlisis es la mejor manera de Engineer, manufacturing un problema de salud de forma temprana y ayudarlo a Automotive engineer una cada. El diagnstico y tratamiento tempranos le brindan la mejor oportunidad de Chief Operating Officer las afecciones mdicas ms comunes en las personas mayores de 65 aos de edad. Las cadas son la causa principal de las fracturas de huesos y lesiones en la cabeza de personas mayores de 65 aos de edad. Tome precauciones para evitar una cada en su casa. Trabaje con el mdico para saber qu cambios que puede hacer para mejorar su salud y  [place], y para prevenir las cadas. Esta informacin no tiene Theme park manager el consejo del mdico. Asegrese de hacerle al mdico cualquier pregunta que tenga. Document Revised: 01/22/2021 Document Reviewed: 01/22/2021 Elsevier Patient Education  2024 [employer].

## 2023-09-10 LAB — URINE CULTURE

## 2023-09-13 ENCOUNTER — Telehealth: Payer: Self-pay

## 2023-09-13 ENCOUNTER — Other Ambulatory Visit: Payer: Self-pay | Admitting: Radiology

## 2023-09-13 DIAGNOSIS — E785 Hyperlipidemia, unspecified: Secondary | ICD-10-CM

## 2023-09-13 DIAGNOSIS — E039 Hypothyroidism, unspecified: Secondary | ICD-10-CM

## 2023-09-13 MED ORDER — ATORVASTATIN CALCIUM 10 MG PO TABS: 10.0000 mg | ORAL_TABLET | Freq: Every day | ORAL | 1 refills | Status: DC

## 2023-09-13 MED ORDER — LEVOTHYROXINE SODIUM 75 MCG PO TABS: 75.0000 ug | ORAL_TABLET | Freq: Every day | ORAL | 3 refills | Status: AC

## 2023-09-13 NOTE — Telephone Encounter (Signed)
 Copied from CRM [id number]. Topic: Clinical - Lab/Test Results >> Sep 13, 2023  2:33 PM Irine Seal wrote:  Reason for CRM: Pt returning missed call from  [place], [place]  callback: [phone]

## 2023-09-15 DIAGNOSIS — Z936 Other artificial openings of urinary tract status: Secondary | ICD-10-CM | POA: Diagnosis not present

## 2023-09-15 DIAGNOSIS — Z436 Encounter for attention to other artificial openings of urinary tract: Secondary | ICD-10-CM | POA: Diagnosis not present

## 2023-10-04 ENCOUNTER — Ambulatory Visit (HOSPITAL_COMMUNITY)
Admission: RE | Admit: 2023-10-04 | Discharge: 2023-10-04 | Disposition: A | Discharge status: Home / Self Care | Payer: Medicare HMO | Source: Ambulatory Visit | Attending: Urology | Admitting: Urology

## 2023-10-04 ENCOUNTER — Other Ambulatory Visit (HOSPITAL_COMMUNITY): Payer: Self-pay | Admitting: Urology

## 2023-10-04 DIAGNOSIS — C678 Malignant neoplasm of overlapping sites of bladder: Secondary | ICD-10-CM | POA: Diagnosis not present

## 2023-10-04 DIAGNOSIS — C679 Malignant neoplasm of bladder, unspecified: Secondary | ICD-10-CM | POA: Diagnosis not present

## 2023-10-04 DIAGNOSIS — R918 Other nonspecific abnormal finding of lung field: Secondary | ICD-10-CM | POA: Diagnosis not present

## 2023-10-07 DIAGNOSIS — C679 Malignant neoplasm of bladder, unspecified: Secondary | ICD-10-CM | POA: Diagnosis not present

## 2023-10-07 DIAGNOSIS — C678 Malignant neoplasm of overlapping sites of bladder: Secondary | ICD-10-CM | POA: Diagnosis not present

## 2023-10-07 DIAGNOSIS — N1330 Unspecified hydronephrosis: Secondary | ICD-10-CM | POA: Diagnosis not present

## 2023-10-07 DIAGNOSIS — C61 Malignant neoplasm of prostate: Secondary | ICD-10-CM | POA: Diagnosis not present

## 2023-11-03 DIAGNOSIS — Z436 Encounter for attention to other artificial openings of urinary tract: Secondary | ICD-10-CM | POA: Diagnosis not present

## 2023-11-03 DIAGNOSIS — Z936 Other artificial openings of urinary tract status: Secondary | ICD-10-CM | POA: Diagnosis not present

## 2023-11-15 DIAGNOSIS — C61 Malignant neoplasm of prostate: Secondary | ICD-10-CM | POA: Diagnosis not present

## 2023-11-15 DIAGNOSIS — R8271 Bacteriuria: Secondary | ICD-10-CM | POA: Diagnosis not present

## 2023-11-15 DIAGNOSIS — Z936 Other artificial openings of urinary tract status: Secondary | ICD-10-CM | POA: Diagnosis not present

## 2023-11-15 DIAGNOSIS — N130 Hydronephrosis with ureteropelvic junction obstruction: Secondary | ICD-10-CM | POA: Diagnosis not present

## 2023-11-15 DIAGNOSIS — C678 Malignant neoplasm of overlapping sites of bladder: Secondary | ICD-10-CM | POA: Diagnosis not present

## 2023-12-16 DIAGNOSIS — Z436 Encounter for attention to other artificial openings of urinary tract: Secondary | ICD-10-CM | POA: Diagnosis not present

## 2023-12-16 DIAGNOSIS — Z936 Other artificial openings of urinary tract status: Secondary | ICD-10-CM | POA: Diagnosis not present

## 2023-12-28 DIAGNOSIS — Z436 Encounter for attention to other artificial openings of urinary tract: Secondary | ICD-10-CM | POA: Diagnosis not present

## 2024-01-18 DIAGNOSIS — Z936 Other artificial openings of urinary tract status: Secondary | ICD-10-CM | POA: Diagnosis not present

## 2024-01-18 DIAGNOSIS — Z436 Encounter for attention to other artificial openings of urinary tract: Secondary | ICD-10-CM | POA: Diagnosis not present

## 2024-01-28 ENCOUNTER — Other Ambulatory Visit: Payer: Self-pay | Admitting: Emergency Medicine

## 2024-01-28 DIAGNOSIS — E785 Hyperlipidemia, unspecified: Secondary | ICD-10-CM

## 2024-02-26 DIAGNOSIS — Z936 Other artificial openings of urinary tract status: Secondary | ICD-10-CM | POA: Diagnosis not present

## 2024-02-26 DIAGNOSIS — Z436 Encounter for attention to other artificial openings of urinary tract: Secondary | ICD-10-CM | POA: Diagnosis not present

## 2024-03-09 ENCOUNTER — Ambulatory Visit: Payer: Medicare HMO | Admitting: Emergency Medicine

## 2024-03-09 ENCOUNTER — Encounter: Payer: Self-pay | Admitting: Emergency Medicine

## 2024-03-09 VITALS — BP 124/62 | HR 75 | Temp 98.2°F | Ht 63.0 in | Wt 158.0 lb

## 2024-03-09 DIAGNOSIS — C678 Malignant neoplasm of overlapping sites of bladder: Secondary | ICD-10-CM | POA: Diagnosis not present

## 2024-03-09 DIAGNOSIS — R4189 Other symptoms and signs involving cognitive functions and awareness: Secondary | ICD-10-CM

## 2024-03-09 DIAGNOSIS — C679 Malignant neoplasm of bladder, unspecified: Secondary | ICD-10-CM

## 2024-03-09 DIAGNOSIS — E039 Hypothyroidism, unspecified: Secondary | ICD-10-CM

## 2024-03-09 DIAGNOSIS — E785 Hyperlipidemia, unspecified: Secondary | ICD-10-CM

## 2024-03-09 NOTE — Assessment & Plan Note (Signed)
Chronic stable condition Diet and nutrition discussed Continue atorvastatin 10 mg daily

## 2024-03-09 NOTE — Assessment & Plan Note (Signed)
 Clinically stable.  No concerns. Needs to follow-up with urologist

## 2024-03-09 NOTE — Assessment & Plan Note (Signed)
Clinically euthyroid.  Continue levothyroxine 75 mcg daily. 

## 2024-03-09 NOTE — Progress Notes (Signed)
 Joel Adkins Grip 83 y.o.   Chief Complaint  Patient presents with   Follow-up    Patient here for 6 month f/u. No other concerns.    HISTORY OF PRESENT ILLNESS: This is a 83 y.o. male accompanied by son here for 68-month follow-up of chronic medical conditions Concerned about his mental abilities.  Complaining of being forgetful and having memory problems. No other complaints or medical concerns today.  HPI   Prior to Admission medications   Medication Sig Start Date End Date Taking? Authorizing Provider  atorvastatin  (LIPITOR) 10 MG tablet TOME 1 TABLETA POR VIA ORAL TODOS LOS DIAS 01/28/24  Yes Tramaine Snell, Emil Schanz, MD  levothyroxine  (SYNTHROID ) 75 MCG tablet Take 1 tablet (75 mcg total) by mouth daily before breakfast. 09/13/23  Yes Marliyah Reid, Emil Schanz, MD  senna-docusate (SENOKOT-S) 8.6-50 MG tablet Take 1 tablet by mouth 2 (two) times daily. While taking strong pain meds to prevent constipation (please label in Spanish) Patient not taking: Reported on 03/09/2024 11/10/22   Alvaro Ricardo KATHEE Mickey., MD    No Known Allergies  Patient Active Problem List   Diagnosis Date Noted   Encounter for ostomy care education 11/03/2022   Encounter for health education 11/03/2022   Bladder cancer (HCC) 09/01/2022   Malignant neoplasm of overlapping sites of bladder (HCC) 08/27/2022   Hypothyroidism 05/28/2022   Dyslipidemia 04/06/2019   Benign prostatic hyperplasia without lower urinary tract symptoms 04/06/2019   Elevated blood pressure reading 04/06/2019    Past Medical History:  Diagnosis Date   Asthma    hx of, currently no symptoms   BPH (benign prostatic hyperplasia)    Follows with PCP, Dr. Emil Schanz @ [facility], L[facility] 08/27/22.   COVID-19    2020 or 2021, no hospitalization   HLD (hyperlipidemia)    Follows w/ PCP, Dr. Emil Schanz @ [facility], L[facility] 08/27/22.   Hypothyroid    Follows w/ PCP, Dr. Emil Schanz @ [facility], L[facility] 08/27/22.    Pre-diabetes    01/29/22 HgbA1c 6.3   Wears glasses     Past Surgical History:  Procedure Laterality Date   CYSTOSCOPY  07/2022   CYSTOSCOPY WITH INJECTION N/A 11/04/2022   Procedure: CYSTOSCOPY WITH INJECTION OF INDOCYANINE GREEN  DYE urethral dilation;  Surgeon: Alvaro Ricardo, MD;  Location: [facility];  Service: Urology;  Laterality: N/A;   FINGER SURGERY Left 1997   middle finger   HERNIA REPAIR Bilateral 2016   LYMPH NODE DISSECTION Bilateral 11/04/2022   Procedure: PELVIC LYMPH NODE DISSECTION;  Surgeon: Alvaro Ricardo, MD;  Location: [facility];  Service: Urology;  Laterality: Bilateral;   ROBOT ASSISTED LAPAROSCOPIC COMPLETE CYSTECT ILEAL CONDUIT N/A 11/04/2022   Procedure: XI ROBOTIC ASSISTED LAPAROSCOPIC COMPLETE CYSTECT ILEAL CONDUIT;  Surgeon: Alvaro Ricardo, MD;  Location: [facility];  Service: Urology;  Laterality: N/A;   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   Procedure: TOTAL HIP ARTHROPLASTY ANTERIOR APPROACH;  Surgeon: Kathlynn Sharper, MD;  Location: [facility];  Service: Orthopedics;  Laterality: Left;   TRANSURETHRAL RESECTION OF BLADDER TUMOR N/A 09/01/2022   Procedure: TRANSURETHRAL RESECTION OF BLADDER TUMOR (TURBT);  Surgeon: Rosalind Zachary KATHEE, MD;  Location: [facility];  Service: Urology;  Laterality: N/A;   TRANSURETHRAL RESECTION OF PROSTATE N/A 09/01/2022   Procedure: TRANSURETHRAL RESECTION OF THE PROSTATE (TURP);  Surgeon: Rosalind Zachary KATHEE, MD;  Location[facility];  Service: Urology;  Laterality: N/A;    Social History   Socioeconomic History   Marital status: Married  Spouse name: Not on file   Number of children: 2   Years of education: Not on file   Highest education level: Not on file  Occupational History   Occupation: Retired  Tobacco Use   Smoking status: Former    Current packs/day: 0.00    Types: Cigarettes    Quit date: 06/15/1964    Years since quitting: 59.7   Smokeless tobacco: Never  Vaping Use   Vaping status: Never  Used  Substance and Sexual Activity   Alcohol  use: No   Drug use: No   Sexual activity: Not Currently  Other Topics Concern   Not on file  Social History Narrative   Lives with wife and 1 son.   Social Drivers of Corporate investment banker Strain: Low Risk  (07/22/2023)   Overall Financial Resource Strain (CARDIA)    Difficulty of Paying Living Expenses: Not hard at all  Food Insecurity: No Food Insecurity (07/22/2023)   Hunger Vital Sign    Worried About Running Out of Food in the Last Year: Never true    Ran Out of Food in the Last Year: Never true  Transportation Needs: No Transportation Needs (07/22/2023)   PRAPARE - Administrator, Civil Service (Medical): No    Lack of Transportation (Non-Medical): No  Physical Activity: Insufficiently Active (07/22/2023)   Exercise Vital Sign    Days of Exercise per Week: 5 days    Minutes of Exercise per Session: 20 min  Stress: No Stress Concern Present (07/22/2023)   [employer] of Occupational Health - Occupational Stress Questionnaire    Feeling of Stress : Not at all  Social Connections: Moderately Integrated (07/22/2023)   Social Connection and Isolation Panel    Frequency of Communication with Friends and Family: Never    Frequency of Social Gatherings with Friends and Family: More than three times a week    Attends Religious Services: More than 4 times per year    Active Member of [employer] or Organizations: No    Attends Banker Meetings: Never    Marital Status: Married  Catering manager Violence: Not At Risk (07/22/2023)   Humiliation, Afraid, Rape, and Kick questionnaire    Fear of Current or Ex-Partner: No    Emotionally Abused: No    Physically Abused: No    Sexually Abused: No    History reviewed. No pertinent family history.   Review of Systems  Constitutional: Negative.  Negative for chills and fever.  HENT: Negative.  Negative for congestion and sore throat.   Respiratory:  Negative.  Negative for cough and shortness of breath.   Cardiovascular: Negative.  Negative for chest pain and palpitations.  Gastrointestinal:  Negative for abdominal pain, diarrhea, nausea and vomiting.  Genitourinary: Negative.  Negative for dysuria and hematuria.  Skin: Negative.  Negative for rash.  Neurological: Negative.  Negative for dizziness and headaches.  All other systems reviewed and are negative.   Vitals:   03/09/24 1531  BP: 124/62  Pulse: 75  Temp: 98.2 F (36.8 C)  SpO2: 97%    Physical Exam Vitals reviewed.  Constitutional:      Appearance: Normal appearance.  HENT:     Head: Normocephalic.     Mouth/Throat:     Mouth: Mucous membranes are moist.     Pharynx: Oropharynx is clear.  Eyes:     Extraocular Movements: Extraocular movements intact.     Conjunctiva/sclera: Conjunctivae normal.     Pupils: Pupils are  equal, round, and reactive to light.  Cardiovascular:     Rate and Rhythm: Normal rate and regular rhythm.     Pulses: Normal pulses.     Heart sounds: Normal heart sounds.  Pulmonary:     Effort: Pulmonary effort is normal.     Breath sounds: Normal breath sounds.  Abdominal:     Palpations: Abdomen is soft.     Tenderness: There is no abdominal tenderness.     Comments: Urostomy bag in place  Musculoskeletal:     Cervical back: No tenderness.  Lymphadenopathy:     Cervical: No cervical adenopathy.  Skin:    General: Skin is warm and dry.     Capillary Refill: Capillary refill takes less than 2 seconds.  Neurological:     General: No focal deficit present.     Mental Status: He is alert and oriented to person, place, and time.  Psychiatric:        Mood and Affect: Mood normal.        Behavior: Behavior normal.      ASSESSMENT & PLAN: A total of 42 minutes was spent with the patient and counseling/coordination of care regarding preparing for this visit, review of most recent office visit notes, review of multiple chronic medical  conditions and their management, review of all medications, review of most recent bloodwork results, review of health maintenance items, education on nutrition, prognosis, documentation, and need for follow up.   Problem List Items Addressed This Visit       Endocrine   Hypothyroidism   Clinically euthyroid Continue levothyroxine  75 mcg daily        Genitourinary   Malignant neoplasm of overlapping sites of bladder (HCC)   Clinical stable.  Status post surgery with good results Has follow-up appointment next August with urology      Bladder cancer [facility])   Clinically stable.  No concerns. Needs to follow-up with urologist          Other   Dyslipidemia   Chronic stable condition Diet and nutrition discussed Continue atorvastatin  10 mg daily      Cognitive decline - Primary   Active and affecting quality of life Recommend neurology evaluation Referral placed today      Relevant Orders   Ambulatory referral to Neurology   Patient Instructions  Mantenimiento de la salud despus de los 65 aos de edad Health Maintenance After Age 68 Despus de los 65 aos de edad, corre un riesgo mayor de Film/video editor enfermedades e infecciones a largo plazo, como tambin de sufrir lesiones por cadas. Las cadas son la causa principal de las fracturas de huesos y lesiones en la cabeza de personas mayores de 65 aos de edad. Recibir cuidados preventivos de forma regular puede ayudarlo a mantenerse saludable y en buen [place]. Los cuidados preventivos incluyen realizarse anlisis de forma regular y Forensic psychologist en el estilo de vida segn las recomendaciones del mdico. Converse con el mdico sobre lo siguiente: Las pruebas de deteccin y los anlisis que debe International aid/development worker. Una prueba de deteccin es un estudio que se para Engineer, manufacturing la presencia de una enfermedad cuando no tiene sntomas. Un plan de dieta y ejercicios adecuado para usted. Qu debo saber sobre las pruebas de deteccin y los  anlisis para prevenir cadas? Realizarse pruebas de deteccin y [employer] es la mejor manera de Engineer, manufacturing un problema de salud de forma temprana. El diagnstico y tratamiento tempranos le brindan la mejor oportunidad de Chief Operating Officer las afecciones mdicas que  son comunes despus de los 65 aos de edad. Ciertas afecciones y elecciones de estilo de vida pueden hacer que sea ms propenso a sufrir Engineer, site. El mdico puede recomendarle lo siguiente: Controles regulares de la visin. Una visin deficiente y afecciones como las cataratas pueden hacer que sea ms propenso a sufrir Engineer, site. Si usa  lentes, asegrese de obtener una receta actualizada si su visin cambia. Revisin de medicamentos. Revise regularmente con el mdico todos los medicamentos que toma, incluidos los medicamentos de [place]. Consulte al [employer] efectos secundarios que pueden hacer que sea ms propenso a sufrir Engineer, site. Informe al mdico si alguno de los medicamentos que toma lo hace sentir mareado o somnoliento. Controles de fuerza y equilibrio. El mdico puede recomendar ciertos estudios para controlar su fuerza y equilibrio al estar de pie, al caminar o al cambiar de posicin. Examen de los pies. El dolor y Materials engineer en los pies, como tambin no utilizar el calzado adecuado, pueden hacer que sea ms propenso a sufrir Engineer, site. Pruebas de deteccin, que incluyen las siguientes: Pruebas de deteccin para la osteoporosis. La osteoporosis es una afeccin que hace que los huesos se tornen ms dbiles y se quiebren con ms facilidad. Pruebas de deteccin para la presin arterial. Los cambios en la presin arterial y los medicamentos para Chief Operating Officer la presin arterial pueden hacerlo sentir mareado. Prueba de deteccin de la depresin. Es ms probable que sufra una cada si tiene miedo a caerse, se siente deprimido o se siente incapaz de Probation officer. Prueba de deteccin de consumo de alcohol . Beber  demasiado alcohol  puede afectar su equilibrio y puede hacer que sea ms propenso a sufrir Engineer, site. Siga estas indicaciones en su casa: Estilo de vida No beba alcohol  si: Su mdico le indica no hacerlo. Si bebe alcohol : Limite la cantidad que bebe a lo siguiente: De 0 a 1 medida por da para las mujeres. De 0 a 2 medidas por da para los hombres. Sepa cunta cantidad de alcohol  hay en las bebidas que toma. En los [address], una medida equivale a una botella de cerveza de 12 oz (355 ml), un vaso de vino de 5 oz (148 ml) o un vaso de una bebida alcohlica de alta graduacin de 1 oz (44 ml). No consuma ningn producto que contenga nicotina o tabaco. Estos productos incluyen cigarrillos, tabaco para Theatre manager y aparatos de vapeo, como los cigarrillos electrnicos. Si necesita ayuda para dejar de consumir estos productos, consulte al [employer]. Actividad  Siga un programa de ejercicio regular para mantenerse en forma. Esto lo ayudar a Radio producer equilibrio. Consulte al mdico qu tipos de ejercicios son adecuados para usted. Si necesita un bastn o un andador, selo segn las recomendaciones del mdico. Utilice calzado con buen apoyo y suela antideslizante. Seguridad  Retire los [employer] puedan causar tropiezos tales como alfombras, cables u obstculos. Instale equipos de seguridad, como barras para sostn en los baos y barandas de seguridad en las escaleras. Mantenga las habitaciones y los pasillos bien iluminados. Indicaciones generales Hable con el mdico sobre sus riesgos de sufrir una cada. Infrmele a su mdico si: Se cae. Asegrese de informarle a su mdico acerca de todas las cadas, incluso aquellas que parecen ser [employer]. Se siente mareado, cansado (tiene fatiga) o siente que pierde el equilibrio. Use los medicamentos de venta libre y los recetados solamente como se lo haya indicado el mdico. Estos incluyen suplementos. Siga una dieta sana y [place] un  peso saludable. Una  dieta saludable incluye productos lcteos descremados, carnes bajas en contenido de grasa ([place]), fibra de granos enteros, frijoles y [place] frutas y verduras. Mantngase al da con las vacunas. Realcese los estudios de rutina de la salud, dentales y de Wellsite geologist. Resumen Tener un estilo de vida saludable y recibir cuidados preventivos pueden ayudar a Research scientist (physical sciences) salud y el bienestar despus de los 65 aos de [place]. Realizarse pruebas de deteccin y anlisis es la mejor manera de Engineer, manufacturing un problema de salud de forma temprana y ayudarlo a Automotive engineer una cada. El diagnstico y tratamiento tempranos le brindan la mejor oportunidad de Chief Operating Officer las afecciones mdicas ms comunes en las personas mayores de 65 aos de edad. Las cadas son la causa principal de las fracturas de huesos y lesiones en la cabeza de personas mayores de 65 aos de edad. Tome precauciones para evitar una cada en su casa. Trabaje con el mdico para saber qu cambios que puede hacer para mejorar su salud y [place], y para prevenir las cadas. Esta informacin no tiene Theme park manager el consejo del mdico. Asegrese de hacerle al mdico cualquier pregunta que tenga. Document Revised: 01/22/2021 Document Reviewed: 01/22/2021 Elsevier Patient Education  2024 Elsevier Inc.     Emil Schaumann, MD [facility]

## 2024-03-09 NOTE — Assessment & Plan Note (Signed)
Clinical stable.  Status post surgery with good results Has follow-up appointment next August with urology

## 2024-03-09 NOTE — Patient Instructions (Signed)
 Mantenimiento de la salud despus de los 65 aos de edad Health Maintenance After Age 85 Despus de los 65 aos de edad, corre un riesgo mayor de Film/video editor enfermedades e infecciones a Air cabin crew, como tambin de sufrir lesiones por cadas. Las cadas son la causa principal de las fracturas de huesos y lesiones en la cabeza de personas mayores de 65 aos de edad. Recibir cuidados preventivos de forma regular puede ayudarlo a mantenerse saludable y en buen [place]. Los cuidados preventivos incluyen realizarse anlisis de forma regular y Forensic psychologist en el estilo de vida segn las recomendaciones del mdico. Converse con el mdico sobre lo siguiente: Las pruebas de deteccin y los anlisis que debe International aid/development worker. Una prueba de deteccin es un estudio que se para Engineer, manufacturing la presencia de una enfermedad cuando no tiene sntomas. Un plan de dieta y ejercicios adecuado para usted. Qu debo saber sobre las pruebas de deteccin y los anlisis para prevenir cadas? Realizarse pruebas de deteccin y [employer] es la mejor manera de Engineer, manufacturing un problema de salud de forma temprana. El diagnstico y tratamiento tempranos le brindan la mejor oportunidad de Chief Operating Officer las afecciones mdicas que son comunes despus de los 65 aos de edad. Ciertas afecciones y elecciones de estilo de vida pueden hacer que sea ms propenso a sufrir Engineer, site. El mdico puede recomendarle lo siguiente: Controles regulares de la visin. Una visin deficiente y afecciones como las cataratas pueden hacer que sea ms propenso a sufrir Engineer, site. Si [place] lentes, asegrese de obtener una receta actualizada si su visin cambia. Revisin de medicamentos. Revise regularmente con el mdico todos los medicamentos que toma, incluidos los medicamentos de [place]. Consulte al [employer] efectos secundarios que pueden hacer que sea ms propenso a sufrir Engineer, site. Informe al mdico si alguno de los medicamentos que toma lo hace sentir mareado o  somnoliento. Controles de fuerza y equilibrio. El mdico puede recomendar ciertos estudios para controlar su fuerza y equilibrio al estar de pie, al caminar o al cambiar de posicin. Examen de los pies. El dolor y Materials engineer en los pies, como tambin no utilizar el calzado [place], pueden hacer que sea ms propenso a sufrir Engineer, site. Pruebas de deteccin, que incluyen las siguientes: Pruebas de deteccin para la osteoporosis. La osteoporosis es una afeccin que hace que los huesos se tornen ms dbiles y se quiebren con ms facilidad. Pruebas de deteccin para la presin arterial. Los cambios en la presin arterial y los medicamentos para Chief Operating Officer la presin arterial pueden hacerlo sentir mareado. Prueba de deteccin de la depresin. Es ms probable que sufra una cada si tiene miedo a caerse, se siente deprimido o se siente incapaz de Probation officer. Prueba de deteccin de consumo de alcohol. Beber demasiado alcohol puede afectar su equilibrio y puede hacer que sea ms propenso a sufrir Engineer, site. Siga estas indicaciones en su casa: Estilo de vida No beba alcohol si: Su mdico le indica no hacerlo. Si bebe alcohol: Limite la cantidad que bebe a lo siguiente: De 0 a 1 medida por da para las mujeres. De 0 a 2 medidas por da para los hombres. Sepa cunta cantidad de alcohol hay en las bebidas que toma. En los [address], una medida equivale a una botella de cerveza de 12 oz (355 ml), un vaso de vino de 5 oz (148 ml) o un vaso de una bebida alcohlica de alta graduacin de 1 oz (44 ml). No consuma ningn producto que  contenga nicotina o tabaco. Estos productos incluyen cigarrillos, tabaco para Theatre manager y aparatos de vapeo, como los Administrator, Civil Service. Si necesita ayuda para dejar de consumir estos productos, consulte al [employer]. Actividad  Siga un programa de ejercicio regular para mantenerse en forma. Esto lo ayudar a Radio producer equilibrio. Consulte al  mdico qu tipos de ejercicios son adecuados para usted. Si necesita un bastn o un andador, selo segn las recomendaciones del mdico. Utilice calzado con buen apoyo y suela antideslizante. Seguridad  Retire los [employer] puedan causar tropiezos tales como alfombras, cables u obstculos. Instale equipos de seguridad, como barras para sostn en los baos y barandas de seguridad en las escaleras. Mantenga las habitaciones y los pasillos bien iluminados. Indicaciones generales Hable con el mdico sobre sus riesgos de sufrir una cada. Infrmele a su mdico si: Se cae. Asegrese de informarle a su mdico acerca de todas las cadas, incluso aquellas que parecen ser [employer]. Se siente mareado, cansado (tiene fatiga) o siente que pierde el equilibrio. Use los medicamentos de venta libre y los recetados solamente como se lo haya indicado el mdico. Estos incluyen suplementos. Siga una dieta sana y [place] un peso saludable. Una dieta saludable incluye productos lcteos descremados, carnes bajas en contenido de grasa ([place]), fibra de granos enteros, frijoles y [place] frutas y verduras. Mantngase al da con las vacunas. Realcese los estudios de rutina de la salud, dentales y de Wellsite geologist. Resumen Tener un estilo de vida saludable y recibir cuidados preventivos pueden ayudar a Research scientist (physical sciences) salud y el bienestar despus de los 65 aos de [place]. Realizarse pruebas de deteccin y [employer] es la mejor manera de Engineer, manufacturing un problema de salud de forma temprana y [place] a Automotive engineer una cada. El diagnstico y tratamiento tempranos le brindan la mejor oportunidad de Chief Operating Officer las afecciones mdicas ms comunes en las personas mayores de 65 aos de edad. Las cadas son la causa principal de las fracturas de huesos y lesiones en la cabeza de personas mayores de 65 aos de edad. Tome precauciones para evitar una cada en su casa. Trabaje con el mdico para saber qu cambios que puede hacer para mejorar su salud y  [place], y para prevenir las cadas. Esta informacin no tiene Theme park manager el consejo del mdico. Asegrese de hacerle al mdico cualquier pregunta que tenga. Document Revised: 01/22/2021 Document Reviewed: 01/22/2021 Elsevier Patient Education  2024 [employer].

## 2024-03-09 NOTE — Assessment & Plan Note (Signed)
 Active and affecting quality of life Recommend neurology evaluation Referral placed today

## 2024-03-20 ENCOUNTER — Encounter: Payer: Self-pay | Admitting: Physician Assistant

## 2024-03-25 DIAGNOSIS — Z436 Encounter for attention to other artificial openings of urinary tract: Secondary | ICD-10-CM | POA: Diagnosis not present

## 2024-03-25 DIAGNOSIS — Z936 Other artificial openings of urinary tract status: Secondary | ICD-10-CM | POA: Diagnosis not present

## 2024-04-26 DIAGNOSIS — Z436 Encounter for attention to other artificial openings of urinary tract: Secondary | ICD-10-CM | POA: Diagnosis not present

## 2024-04-26 DIAGNOSIS — Z936 Other artificial openings of urinary tract status: Secondary | ICD-10-CM | POA: Diagnosis not present

## 2024-05-04 ENCOUNTER — Other Ambulatory Visit (HOSPITAL_COMMUNITY): Payer: Self-pay | Admitting: Urology

## 2024-05-04 ENCOUNTER — Ambulatory Visit (HOSPITAL_COMMUNITY)
Admission: RE | Admit: 2024-05-04 | Discharge: 2024-05-04 | Disposition: A | Discharge status: Home / Self Care | Source: Ambulatory Visit | Attending: Urology | Admitting: Urology

## 2024-05-04 DIAGNOSIS — I700 Atherosclerosis of aorta: Secondary | ICD-10-CM | POA: Diagnosis not present

## 2024-05-04 DIAGNOSIS — Z8546 Personal history of malignant neoplasm of prostate: Secondary | ICD-10-CM | POA: Diagnosis not present

## 2024-05-04 DIAGNOSIS — C678 Malignant neoplasm of overlapping sites of bladder: Secondary | ICD-10-CM | POA: Diagnosis not present

## 2024-05-04 DIAGNOSIS — C61 Malignant neoplasm of prostate: Secondary | ICD-10-CM

## 2024-05-11 DIAGNOSIS — C679 Malignant neoplasm of bladder, unspecified: Secondary | ICD-10-CM | POA: Diagnosis not present

## 2024-05-11 DIAGNOSIS — R935 Abnormal findings on diagnostic imaging of other abdominal regions, including retroperitoneum: Secondary | ICD-10-CM | POA: Diagnosis not present

## 2024-05-11 DIAGNOSIS — C678 Malignant neoplasm of overlapping sites of bladder: Secondary | ICD-10-CM | POA: Diagnosis not present

## 2024-05-23 DIAGNOSIS — C61 Malignant neoplasm of prostate: Secondary | ICD-10-CM | POA: Diagnosis not present

## 2024-05-23 DIAGNOSIS — R8271 Bacteriuria: Secondary | ICD-10-CM | POA: Diagnosis not present

## 2024-05-23 DIAGNOSIS — N130 Hydronephrosis with ureteropelvic junction obstruction: Secondary | ICD-10-CM | POA: Diagnosis not present

## 2024-05-23 DIAGNOSIS — C678 Malignant neoplasm of overlapping sites of bladder: Secondary | ICD-10-CM | POA: Diagnosis not present

## 2024-05-23 DIAGNOSIS — Z936 Other artificial openings of urinary tract status: Secondary | ICD-10-CM | POA: Diagnosis not present

## 2024-05-24 NOTE — Progress Notes (Incomplete)
 Assessment/Plan:     Joel Adkins is a very pleasant 83 y.o. year old RH male with a history of hypertension, hyperlipidemia, hypothyroidism, bladder cancer, history of L midbrain cavernous angioma  (dx 2009)seen today for evaluation of memory loss. MoCA today is .  Etiology is unclear, workup is in progress.  Patient is able to participate on ADLs*** Discussed starting donepezil 5 mg daily with goal of 10 mg daily if tolerated, patient agrees to proceed.   Memory Impairment of unclear etiology, concern for ***  MRI brain without contrast to assess for underlying structural abnormality and assess vascular load  MRA head to frhter evaluate know cavernous angioma *** Neurocognitive testing to further evaluate cognitive concerns and determine other underlying cause of memory changes, including potential contribution from sleep, anxiety, attention, or depression among others  Check B12, TSH Recommend good control of cardiovascular risk factors.   Continue to control mood as per PCP Folllow up in ***months   Subjective:    The patient is accompanied by ***  who supplements  the history.    How long did patient have memory difficulties?  For about.  Patient reports some difficulty remembering new information, recent conversations, names. repeats oneself?  Endorsed Disoriented when walking into a room? Denies ***  Leaving objects in unusual places?  Denies.   Wandering behavior? Denies.   Any personality changes, or depression, anxiety? Denies *** Hallucinations or paranoia? Denies.   Seizures? Denies.    Any sleep changes?  Sleeps well *** Does not sleep well. **  frequent nightmares or dream reenactment, other REM behavior or sleepwalking   Sleep apnea? Denies.   Any hygiene concerns?  Denies.   Independent of bathing and dressing? Endorsed  Does the patient need help with medications?  is in charge *** Who is in charge of the finances?  is in charge   *** Any changes in  appetite?   Denies. ***   Patient have trouble swallowing?  Denies.   Does the patient cook? No*** yes, denies forgetting common recipes or kitchen accidents *** Any headaches?  Denies.   Chronic pain? Denies.   Ambulates with difficulty? Denies. ***  Needs a cane*** Needs a walker *** to ambulate for stability.   Recent falls or head injuries? Denies.     Vision changes?  Denies any new issues.  Has a history of*** Any strokelike symptoms? Denies.   Any tremors? Denies. *** Any anosmia? Denies.   Any incontinence of urine? Has urostomy bag, history of bladder cancer   Any bowel dysfunction? Denies.      Patient lives with wife and son***  History of heavy alcohol  intake? Denies.   History of heavy tobacco use? Denies.   Family history of dementia?   *** with dementia  Does patient drive? No longer drives  *** yes, denies getting lost.***   Pertinent labs: ***  MRI brain personally reviewed: ***  No Known Allergies  Current Outpatient Medications  Medication Instructions   atorvastatin  (LIPITOR) 10 MG tablet TOME 1 TABLETA POR VIA ORAL TODOS LOS DIAS   levothyroxine  (SYNTHROID ) 75 mcg, Oral, Daily before breakfast   senna-docusate (SENOKOT-S) 8.6-50 MG tablet 1 tablet, Oral, 2 times daily, While taking strong pain meds to prevent constipation (please label in Spanish)     VITALS:  There were no vitals filed for this visit.   Physical Exam  :     No data to display  No data to display             HEENT:  Normocephalic, atraumatic.  The superficial temporal arteries are without ropiness or tenderness. Cardiovascular: Regular rate and rhythm. Lungs: Clear to auscultation bilaterally. Neck: There are no carotid bruits noted bilaterally. Orientation:  Alert and oriented to person, place and not to time***. No aphasia or dysarthria. Fund of knowledge is appropriate. Recent and remote memory impaired.  Attention and concentration are reduced***.  Able to  name objects and repeat phrases. *** Delayed recall  /5 .*** Cranial nerves: There is good facial symmetry. Extraocular muscles are intact and visual fields are full to confrontational testing. Speech is fluent and clear. No tongue deviation. Hearing is intact to conversational tone.*** Tone: Tone is good throughout. Sensation: Sensation is intact to light touch.  Vibration is intact at the bilateral big toe.  Coordination: The patient has no difficulty with RAM's or FNF bilaterally. Normal finger to nose  Motor: Strength is 5/5 in the bilateral upper and lower extremities. There is no pronator drift. There are no fasciculations noted. DTR's: Deep tendon reflexes are 2/4 bilaterally. Gait and Station: The patient is able to ambulate without difficulty. Gait is cautious and narrow. Stride length is normal. ***      Thank you for allowing us  the opportunity to participate in the care of this nice patient. Please do not hesitate to contact us  for any questions or concerns.   Total time spent on today's visit was *** minutes dedicated to this patient today, preparing to see patient, examining the patient, ordering tests and/or medications and counseling the patient, documenting clinical information in the EHR or other health record, independently interpreting results and communicating results to the patient/family, discussing treatment and goals, answering patient's questions and coordinating care.  Cc:  Purcell Emil Schanz, MD  Camie Sevin 05/24/2024 6:11 PM

## 2024-05-25 ENCOUNTER — Encounter

## 2024-05-25 ENCOUNTER — Encounter: Payer: Self-pay | Admitting: Physician Assistant

## 2024-05-27 DIAGNOSIS — Z008 Encounter for other general examination: Secondary | ICD-10-CM | POA: Diagnosis not present

## 2024-06-12 ENCOUNTER — Other Ambulatory Visit: Payer: Self-pay | Admitting: Emergency Medicine

## 2024-06-12 DIAGNOSIS — E785 Hyperlipidemia, unspecified: Secondary | ICD-10-CM

## 2024-06-13 DIAGNOSIS — Z936 Other artificial openings of urinary tract status: Secondary | ICD-10-CM | POA: Diagnosis not present

## 2024-06-13 DIAGNOSIS — Z436 Encounter for attention to other artificial openings of urinary tract: Secondary | ICD-10-CM | POA: Diagnosis not present

## 2024-07-29 DIAGNOSIS — Z936 Other artificial openings of urinary tract status: Secondary | ICD-10-CM | POA: Diagnosis not present

## 2024-07-29 DIAGNOSIS — Z436 Encounter for attention to other artificial openings of urinary tract: Secondary | ICD-10-CM | POA: Diagnosis not present

## 2024-08-03 ENCOUNTER — Ambulatory Visit

## 2024-08-15 ENCOUNTER — Encounter: Payer: Self-pay | Admitting: Physician Assistant

## 2024-08-15 ENCOUNTER — Encounter

## 2024-09-14 ENCOUNTER — Ambulatory Visit: Admitting: Emergency Medicine

## 2024-10-03 ENCOUNTER — Ambulatory Visit: Admitting: Emergency Medicine

## 2024-10-18 ENCOUNTER — Encounter: Admitting: Physician Assistant

## 2024-10-18 ENCOUNTER — Encounter
